# Patient Record
Sex: Female | Born: 1975 | Race: White | Hispanic: No | Marital: Married
Health system: Southern US, Community
[De-identification: ages and names within clinical notes are randomized; demographics above are authoritative.]

## PROBLEM LIST (undated history)

## (undated) DIAGNOSIS — Z720 Tobacco use: Secondary | ICD-10-CM

## (undated) DIAGNOSIS — G894 Chronic pain syndrome: Secondary | ICD-10-CM

## (undated) DIAGNOSIS — F418 Other specified anxiety disorders: Secondary | ICD-10-CM

## (undated) DIAGNOSIS — F1990 Other psychoactive substance use, unspecified, uncomplicated: Secondary | ICD-10-CM

## (undated) HISTORY — PX: KIDNEY SURGERY: SHX687

## (undated) HISTORY — PX: APPENDECTOMY: SHX54

---

## 2013-02-11 DIAGNOSIS — Z79891 Long term (current) use of opiate analgesic: Secondary | ICD-10-CM

## 2016-11-25 DIAGNOSIS — E871 Hypo-osmolality and hyponatremia: Secondary | ICD-10-CM

## 2016-11-25 DIAGNOSIS — F1910 Other psychoactive substance abuse, uncomplicated: Secondary | ICD-10-CM

## 2016-11-25 DIAGNOSIS — A419 Sepsis, unspecified organism: Secondary | ICD-10-CM

## 2016-11-25 DIAGNOSIS — E876 Hypokalemia: Secondary | ICD-10-CM

## 2016-11-25 DIAGNOSIS — J181 Lobar pneumonia, unspecified organism: Secondary | ICD-10-CM

## 2016-11-25 DIAGNOSIS — N179 Acute kidney failure, unspecified: Secondary | ICD-10-CM | POA: Diagnosis not present

## 2016-11-25 DIAGNOSIS — N390 Urinary tract infection, site not specified: Secondary | ICD-10-CM

## 2016-11-26 DIAGNOSIS — N179 Acute kidney failure, unspecified: Secondary | ICD-10-CM | POA: Diagnosis not present

## 2016-11-26 DIAGNOSIS — J181 Lobar pneumonia, unspecified organism: Secondary | ICD-10-CM | POA: Diagnosis not present

## 2016-11-26 DIAGNOSIS — A419 Sepsis, unspecified organism: Secondary | ICD-10-CM | POA: Diagnosis not present

## 2016-11-26 DIAGNOSIS — F1910 Other psychoactive substance abuse, uncomplicated: Secondary | ICD-10-CM | POA: Diagnosis not present

## 2016-11-27 DIAGNOSIS — N390 Urinary tract infection, site not specified: Secondary | ICD-10-CM

## 2016-11-27 DIAGNOSIS — A419 Sepsis, unspecified organism: Secondary | ICD-10-CM

## 2016-11-27 DIAGNOSIS — F1910 Other psychoactive substance abuse, uncomplicated: Secondary | ICD-10-CM | POA: Diagnosis not present

## 2016-11-27 DIAGNOSIS — N179 Acute kidney failure, unspecified: Secondary | ICD-10-CM | POA: Diagnosis not present

## 2016-11-27 DIAGNOSIS — E871 Hypo-osmolality and hyponatremia: Secondary | ICD-10-CM

## 2016-11-27 DIAGNOSIS — J181 Lobar pneumonia, unspecified organism: Secondary | ICD-10-CM | POA: Diagnosis not present

## 2016-11-27 DIAGNOSIS — E876 Hypokalemia: Secondary | ICD-10-CM

## 2016-11-28 DIAGNOSIS — N179 Acute kidney failure, unspecified: Secondary | ICD-10-CM | POA: Diagnosis not present

## 2016-11-28 DIAGNOSIS — A419 Sepsis, unspecified organism: Secondary | ICD-10-CM | POA: Diagnosis not present

## 2016-11-28 DIAGNOSIS — F1910 Other psychoactive substance abuse, uncomplicated: Secondary | ICD-10-CM | POA: Diagnosis not present

## 2016-11-28 DIAGNOSIS — J181 Lobar pneumonia, unspecified organism: Secondary | ICD-10-CM | POA: Diagnosis not present

## 2016-11-29 DIAGNOSIS — N179 Acute kidney failure, unspecified: Secondary | ICD-10-CM | POA: Diagnosis not present

## 2016-11-29 DIAGNOSIS — F1910 Other psychoactive substance abuse, uncomplicated: Secondary | ICD-10-CM | POA: Diagnosis not present

## 2016-11-29 DIAGNOSIS — J181 Lobar pneumonia, unspecified organism: Secondary | ICD-10-CM | POA: Diagnosis not present

## 2016-11-29 DIAGNOSIS — A419 Sepsis, unspecified organism: Secondary | ICD-10-CM | POA: Diagnosis not present

## 2016-12-10 ENCOUNTER — Encounter (HOSPITAL_COMMUNITY): Payer: Self-pay | Admitting: Internal Medicine

## 2016-12-10 ENCOUNTER — Inpatient Hospital Stay (HOSPITAL_COMMUNITY)
Admission: AD | Admit: 2016-12-10 | Discharge: 2016-12-13 | DRG: 485 | Disposition: A | Discharge status: Other Acute Inpatient Hospital | Payer: Medicaid Other | Source: Other Acute Inpatient Hospital | Attending: Internal Medicine | Admitting: Internal Medicine

## 2016-12-10 DIAGNOSIS — D649 Anemia, unspecified: Secondary | ICD-10-CM | POA: Diagnosis present

## 2016-12-10 DIAGNOSIS — M545 Low back pain, unspecified: Secondary | ICD-10-CM

## 2016-12-10 DIAGNOSIS — M25552 Pain in left hip: Secondary | ICD-10-CM | POA: Diagnosis present

## 2016-12-10 DIAGNOSIS — K219 Gastro-esophageal reflux disease without esophagitis: Secondary | ICD-10-CM | POA: Diagnosis present

## 2016-12-10 DIAGNOSIS — M009 Pyogenic arthritis, unspecified: Secondary | ICD-10-CM

## 2016-12-10 DIAGNOSIS — I330 Acute and subacute infective endocarditis: Secondary | ICD-10-CM | POA: Diagnosis present

## 2016-12-10 DIAGNOSIS — G894 Chronic pain syndrome: Secondary | ICD-10-CM | POA: Diagnosis present

## 2016-12-10 DIAGNOSIS — Z9049 Acquired absence of other specified parts of digestive tract: Secondary | ICD-10-CM | POA: Diagnosis not present

## 2016-12-10 DIAGNOSIS — M461 Sacroiliitis, not elsewhere classified: Secondary | ICD-10-CM

## 2016-12-10 DIAGNOSIS — M25569 Pain in unspecified knee: Secondary | ICD-10-CM

## 2016-12-10 DIAGNOSIS — M00062 Staphylococcal arthritis, left knee: Secondary | ICD-10-CM | POA: Diagnosis present

## 2016-12-10 DIAGNOSIS — I76 Septic arterial embolism: Secondary | ICD-10-CM | POA: Diagnosis not present

## 2016-12-10 DIAGNOSIS — R000 Tachycardia, unspecified: Secondary | ICD-10-CM | POA: Diagnosis not present

## 2016-12-10 DIAGNOSIS — Z720 Tobacco use: Secondary | ICD-10-CM | POA: Diagnosis present

## 2016-12-10 DIAGNOSIS — B9561 Methicillin susceptible Staphylococcus aureus infection as the cause of diseases classified elsewhere: Secondary | ICD-10-CM | POA: Diagnosis present

## 2016-12-10 DIAGNOSIS — A4101 Sepsis due to Methicillin susceptible Staphylococcus aureus: Secondary | ICD-10-CM

## 2016-12-10 DIAGNOSIS — Z9889 Other specified postprocedural states: Secondary | ICD-10-CM

## 2016-12-10 DIAGNOSIS — I079 Rheumatic tricuspid valve disease, unspecified: Secondary | ICD-10-CM | POA: Diagnosis not present

## 2016-12-10 DIAGNOSIS — K5900 Constipation, unspecified: Secondary | ICD-10-CM | POA: Diagnosis not present

## 2016-12-10 DIAGNOSIS — I368 Other nonrheumatic tricuspid valve disorders: Secondary | ICD-10-CM

## 2016-12-10 DIAGNOSIS — Z811 Family history of alcohol abuse and dependence: Secondary | ICD-10-CM

## 2016-12-10 DIAGNOSIS — R0600 Dyspnea, unspecified: Secondary | ICD-10-CM

## 2016-12-10 DIAGNOSIS — F1910 Other psychoactive substance abuse, uncomplicated: Secondary | ICD-10-CM | POA: Diagnosis present

## 2016-12-10 DIAGNOSIS — F418 Other specified anxiety disorders: Secondary | ICD-10-CM | POA: Diagnosis present

## 2016-12-10 DIAGNOSIS — F1990 Other psychoactive substance use, unspecified, uncomplicated: Secondary | ICD-10-CM | POA: Diagnosis present

## 2016-12-10 DIAGNOSIS — F1120 Opioid dependence, uncomplicated: Secondary | ICD-10-CM | POA: Diagnosis present

## 2016-12-10 DIAGNOSIS — B9689 Other specified bacterial agents as the cause of diseases classified elsewhere: Secondary | ICD-10-CM | POA: Diagnosis present

## 2016-12-10 DIAGNOSIS — R7881 Bacteremia: Secondary | ICD-10-CM

## 2016-12-10 DIAGNOSIS — I2690 Septic pulmonary embolism without acute cor pulmonale: Secondary | ICD-10-CM | POA: Diagnosis not present

## 2016-12-10 DIAGNOSIS — Z882 Allergy status to sulfonamides status: Secondary | ICD-10-CM | POA: Diagnosis present

## 2016-12-10 DIAGNOSIS — R2689 Other abnormalities of gait and mobility: Secondary | ICD-10-CM

## 2016-12-10 HISTORY — DX: Tobacco use: Z72.0

## 2016-12-10 HISTORY — DX: Chronic pain syndrome: G89.4

## 2016-12-10 HISTORY — DX: Other specified anxiety disorders: F41.8

## 2016-12-10 HISTORY — DX: Other psychoactive substance use, unspecified, uncomplicated: F19.90

## 2016-12-10 MED ORDER — SODIUM CHLORIDE 0.9% FLUSH
3.0000 mL | Freq: Two times a day (BID) | INTRAVENOUS | Status: DC
  Administered 2016-12-10 – 2016-12-11 (×2): 3 mL via INTRAVENOUS

## 2016-12-10 MED ORDER — ACETAMINOPHEN 325 MG PO TABS: 650.0000 mg | ORAL_TABLET | Freq: Four times a day (QID) | ORAL | Status: DC | PRN

## 2016-12-10 MED ORDER — FAMOTIDINE 20 MG PO TABS
20.0000 mg | ORAL_TABLET | Freq: Two times a day (BID) | ORAL | Status: DC
  Administered 2016-12-10 – 2016-12-13 (×6): 20 mg via ORAL
  Filled 2016-12-10 (×6): qty 1

## 2016-12-10 MED ORDER — AMITRIPTYLINE HCL 25 MG PO TABS
25.0000 mg | ORAL_TABLET | Freq: Every day | ORAL | Status: DC
  Administered 2016-12-10 – 2016-12-12 (×3): 25 mg via ORAL
  Filled 2016-12-10 (×3): qty 1

## 2016-12-10 MED ORDER — GABAPENTIN 600 MG PO TABS
600.0000 mg | ORAL_TABLET | Freq: Three times a day (TID) | ORAL | Status: DC
  Administered 2016-12-10 – 2016-12-13 (×9): 600 mg via ORAL
  Filled 2016-12-10 (×9): qty 1

## 2016-12-10 MED ORDER — FERROUS SULFATE 325 (65 FE) MG PO TABS
325.0000 mg | ORAL_TABLET | Freq: Every day | ORAL | Status: DC
  Administered 2016-12-11: 325 mg via ORAL
  Filled 2016-12-10: qty 1

## 2016-12-10 MED ORDER — DULOXETINE HCL 30 MG PO CPEP
30.0000 mg | ORAL_CAPSULE | Freq: Every day | ORAL | Status: DC
  Administered 2016-12-11 – 2016-12-13 (×3): 30 mg via ORAL
  Filled 2016-12-10 (×3): qty 1

## 2016-12-10 MED ORDER — ZOLPIDEM TARTRATE 5 MG PO TABS
5.0000 mg | ORAL_TABLET | Freq: Every evening | ORAL | Status: DC | PRN
  Administered 2016-12-10 – 2016-12-11 (×2): 5 mg via ORAL
  Filled 2016-12-10 (×2): qty 1

## 2016-12-10 MED ORDER — ONDANSETRON HCL 4 MG PO TABS: 4.0000 mg | ORAL_TABLET | Freq: Four times a day (QID) | ORAL | Status: DC | PRN

## 2016-12-10 MED ORDER — METHADONE HCL 5 MG PO TABS
10.0000 mg | ORAL_TABLET | Freq: Two times a day (BID) | ORAL | Status: DC
Start: 2016-12-10 — End: 2016-12-11
  Administered 2016-12-10 – 2016-12-11 (×2): 10 mg via ORAL
  Filled 2016-12-10 (×2): qty 2

## 2016-12-10 MED ORDER — CLONAZEPAM 0.5 MG PO TABS
0.5000 mg | ORAL_TABLET | Freq: Two times a day (BID) | ORAL | Status: DC | PRN
  Administered 2016-12-10 – 2016-12-12 (×4): 0.5 mg via ORAL
  Filled 2016-12-10 (×4): qty 1

## 2016-12-10 MED ORDER — NICOTINE 21 MG/24HR TD PT24
21.0000 mg | MEDICATED_PATCH | Freq: Every day | TRANSDERMAL | Status: DC
  Administered 2016-12-10 – 2016-12-13 (×4): 21 mg via TRANSDERMAL
  Filled 2016-12-10 (×4): qty 1

## 2016-12-10 MED ORDER — POLYETHYLENE GLYCOL 3350 17 G PO PACK
17.0000 g | PACK | Freq: Every day | ORAL | Status: DC | PRN
  Administered 2016-12-13: 17 g via ORAL
  Filled 2016-12-10: qty 1

## 2016-12-10 MED ORDER — CEFAZOLIN SODIUM-DEXTROSE 2-4 GM/100ML-% IV SOLN
2.0000 g | Freq: Three times a day (TID) | INTRAVENOUS | Status: DC
  Administered 2016-12-10 – 2016-12-13 (×9): 2 g via INTRAVENOUS
  Filled 2016-12-10 (×11): qty 100

## 2016-12-10 MED ORDER — SODIUM CHLORIDE 0.9 % IV SOLN
INTRAVENOUS | Status: DC
  Administered 2016-12-10 – 2016-12-11 (×2): via INTRAVENOUS

## 2016-12-10 MED ORDER — DOCUSATE SODIUM 100 MG PO CAPS
100.0000 mg | ORAL_CAPSULE | Freq: Every day | ORAL | Status: DC
  Administered 2016-12-11 – 2016-12-13 (×3): 100 mg via ORAL
  Filled 2016-12-10 (×3): qty 1

## 2016-12-10 MED ORDER — DM-GUAIFENESIN ER 30-600 MG PO TB12: 1.0000 | ORAL_TABLET | Freq: Two times a day (BID) | ORAL | Status: DC | PRN

## 2016-12-10 MED ORDER — SODIUM CHLORIDE 0.9 % IV BOLUS (SEPSIS)
1000.0000 mL | Freq: Once | INTRAVENOUS | Status: AC
  Administered 2016-12-10: 1000 mL via INTRAVENOUS

## 2016-12-10 NOTE — H&P (Signed)
History and Physical    Gabriella Ramos ZOX:[id number] DOB: Jan 11, 1976 DOA: 12/10/2016  Referring MD/NP/PA:   PCP: No primary care provider on file.   Patient coming from:  The patient is coming from home.  At baseline, pt is independent for most of ADL.  Chief Complaint: left knee pain and swelling  HPI: Gabriella Ramos is a 41 y.o. female with medical history significant of polysubstance abuse, tobacco abuse, anemia, IVDU, chronic pain syndrome, depression, anxiety, GERD, constipation, who presents with left knee pain and swelling.  Patient was recently admitted to [facility] on 11/25/16 due to right lower lobe pneumonia with multiple bilateral foci. She was found to have acute endocarditis with a fragile large tricuspid vegetation that it was 2 cm in size by 2-D echo. She was transferred to [facility] for continuation of IV Abx of Cefazoline for toal of 8 weeks with a stop date of 01/13/17. While in [facility], patient complains of left kidney swelling and pain. Left knee joint aspiration was done, and culture of synovial fluid grew out methicillin-sensitive staph aureus. Ortho was consulted in [facility] and recommended knee washout. Per report, pt has anemia, hemolytic anemia workup tests including indirect bilirubin, LDH, haptoglobin and peripheral smear were sent out, the results are pending. Pt was transfused with one units of blood on 12/09/16. Her Hgb was 7.9 on 12/10/16. Pt had negative left LE venous doppler for DVT on 12/05/16.  When I saw pt on the floor, she reports left knee pain, which is constant, 10 out of 10 in severity, nonradiating. She does not have a fever, chills. She has some mild dry cough, but no shortness breath or chest pain. No symptoms of  Flu. Patient has nausea, but no vomiting, abdominal pain or diarrhea. She reports dysuria and burning on urination. No unilateral weakness.  ED Course: pt was found to have   Review of Systems:    General: no fevers, chills, no changes in body weight, has poor appetite, has fatigue HEENT: no blurry vision, hearing changes or sore throat Respiratory: no dyspnea, has coughing, no wheezing CV: no chest pain, no palpitations GI: has nausea, no vomiting, abdominal pain, diarrhea, constipation GU: no dysuria, burning on urination, increased urinary frequency, hematuria  Ext: no leg edema. Left knee joint is swelling and tender Neuro: no unilateral weakness, numbness, or tingling, no vision change or hearing loss Skin: no rash, no skin tear. MSK: No muscle spasm, no deformity, no limitation of range of movement in spin Heme: No easy bruising.  Travel history: No recent long distant travel.  Allergy:  Allergies  Allergen Reactions  . Sulfa Antibiotics     Past Medical History:  Diagnosis Date  . Chronic pain syndrome   . Depression with anxiety   . IVDU (intravenous drug user)   . Tobacco abuse     Past Surgical History:  Procedure Laterality Date  . APPENDECTOMY    . CESAREAN SECTION    . KIDNEY SURGERY      Social History:  has no tobacco, alcohol, and drug history on file.  Family History:  Family History  Problem Relation Age of Onset  . Alcoholism Mother      Prior to Admission medications   Not on File    Physical Exam: Vitals:   12/10/16 2100  BP: 129/86  Pulse: (!) 102  Resp: 18  Temp: 98.7 F (37.1 C)  SpO2: 98%   General: Not in acute distress HEENT:  Eyes: PERRL, EOMI, no scleral icterus.       ENT: No discharge from the ears and nose, no pharynx injection, no tonsillar enlargement.        Neck: No JVD, no bruit, no mass felt. Heme: No neck lymph node enlargement. Cardiac: S1/S2, RRR, No murmurs, No gallops or rubs. Respiratory: Good air movement bilaterally. No rales, wheezing, rhonchi or rubs. GI: Soft, nondistended, nontender, no rebound pain, no organomegaly, BS present. GU: No hematuria Ext: No pitting leg edema bilaterally.  2+DP/PT pulse bilaterally. Musculoskeletal: No joint deformities, No joint redness or warmth, no limitation of ROM in spin. Skin: No rashes.  Neuro: Alert, oriented X3, cranial nerves II-XII grossly intact, moves all extremities normally. Muscle strength 5/5 in all extremities, sensation to light touch intact. Brachial reflex 2+ bilaterally. Knee reflex 1+ bilaterally. Negative Babinski's sign. Normal finger to nose test. Psych: Patient is not psychotic, no suicidal or hemocidal ideation.  Labs on Admission: I have personally reviewed following labs and imaging studies  CBC: No results for input(s): WBC, NEUTROABS, HGB, HCT, MCV, PLT in the last 168 hours. Basic Metabolic Panel: No results for input(s): NA, K, CL, CO2, GLUCOSE, BUN, CREATININE, CALCIUM, MG, PHOS in the last 168 hours. GFR: CrCl cannot be calculated (No order found.). Liver Function Tests: No results for input(s): AST, ALT, ALKPHOS, BILITOT, PROT, ALBUMIN in the last 168 hours. No results for input(s): LIPASE, AMYLASE in the last 168 hours. No results for input(s): AMMONIA in the last 168 hours. Coagulation Profile: No results for input(s): INR, PROTIME in the last 168 hours. Cardiac Enzymes: No results for input(s): CKTOTAL, CKMB, CKMBINDEX, TROPONINI in the last 168 hours. BNP (last 3 results) No results for input(s): PROBNP in the last 8760 hours. HbA1C: No results for input(s): HGBA1C in the last 72 hours. CBG: No results for input(s): GLUCAP in the last 168 hours. Lipid Profile: No results for input(s): CHOL, HDL, LDLCALC, TRIG, CHOLHDL, LDLDIRECT in the last 72 hours. Thyroid Function Tests: No results for input(s): TSH, T4TOTAL, FREET4, T3FREE, THYROIDAB in the last 72 hours. Anemia Panel: No results for input(s): VITAMINB12, FOLATE, FERRITIN, TIBC, IRON, RETICCTPCT in the last 72 hours. Urine analysis: No results found for: COLORURINE, APPEARANCEUR, LABSPEC, PHURINE, GLUCOSEU, HGBUR, BILIRUBINUR, KETONESUR,  PROTEINUR, UROBILINOGEN, NITRITE, LEUKOCYTESUR Sepsis Labs: @LABRCNTIP (procalcitonin:4,lacticidven:4) )No results found for this or any previous visit (from the past 240 hour(s)).   Radiological Exams on Admission: No results found.   EKG: Not done yet.   Assessment/Plan Principal Problem:   Septic arthritis of knee, left (HCC) Active Problems:   Tobacco abuse   IVDU (intravenous drug user)   Depression with anxiety   Bacterial endocarditis   Normocytic anemia   Polysubstance abuse   Constipation   Chronic pain syndrome   GERD (gastroesophageal reflux disease)   Septic arthritis of knee, left [facility]): Patient is not septic. Lactate is 0.7. Hemodynamically stable. -admit to tele bed as inpt -Continue IV cefazolin per pharm -f/u blood culture -please call ortho in AM -continue home methadone for pain  Normocytic anemia:  Pt was transfused with one units of blood on 12/09/16. Her Hgb was 7.9 on 12/10/16. Her hgb dropped again to 6.8 today. etiology is not clear. -will check  LDH, haptoglobin and peripheral smear and FOBT -will transfuse 1 unit of blood  Bacterial endocarditis: -On IV cefazolin  Polysubstance abuse, IVDU and Tobacco abuse -Did counseling about importance of quitting substance -Nicotine patch -check UDS  Depression and anxiety: Stable, no suicidal  or homicidal ideations. -Continue home medications: Amitriptyline, Cymbalta, clonopin  Constipation: -Miralax and docusate  Chronic pain syndrome: -methadone  GERD: -Pepcid   DVT ppx: SCD Code Status: Full code Family Communication: None at bed side.      Disposition Plan:  Anticipate discharge back to previous home environment Consults called:  none Admission status: Inpatient/tele      Date of Service 12/10/2016    Lorretta HarpIU, Andalyn Heckstall [facility] Pager [phone]  If 7PM-7AM, please contact night-coverage [email or web address] Password [email or web address] 12/10/2016, 11:38 PM

## 2016-12-10 NOTE — Progress Notes (Signed)
Pharmacy Antibiotic Note  Gabriella Ramos is a 41 y.o. female admitted on 12/10/2016 with septic joint (MSSA).  Pharmacy has been consulted for cefazolin dosing.  Afebrile, baseline labs pending. Per consult note, knee aspiration grew out MSSA.No history on chart will assume normal labs at this time and adjust in the morning as needed.  Plan: Ancef 2g IV q8 hours Follow up baseline labs for adjustments.     Temp (24hrs), Avg:98.7 F (37.1 C), Min:98.7 F (37.1 C), Max:98.7 F (37.1 C)  No results for input(s): WBC, CREATININE, LATICACIDVEN, VANCOTROUGH, VANCOPEAK, VANCORANDOM, GENTTROUGH, GENTPEAK, GENTRANDOM, TOBRATROUGH, TOBRAPEAK, TOBRARND, AMIKACINPEAK, AMIKACINTROU, AMIKACIN in the last 168 hours.  CrCl cannot be calculated (No order found.).    Allergies not on file  Thank you for allowing pharmacy to be a part of this patient's care.  Sheppard CoilFrank Maleik Vanderzee PharmD., BCPS Clinical Pharmacist Pager [phone] 12/10/2016 10:43 PM

## 2016-12-11 ENCOUNTER — Encounter (HOSPITAL_COMMUNITY)
Admission: AD | Disposition: A | Discharge status: Other Acute Inpatient Hospital | Payer: Self-pay | Source: Other Acute Inpatient Hospital | Attending: Internal Medicine

## 2016-12-11 ENCOUNTER — Inpatient Hospital Stay (HOSPITAL_COMMUNITY): Payer: Medicaid Other | Admitting: Certified Registered Nurse Anesthetist

## 2016-12-11 ENCOUNTER — Encounter (HOSPITAL_COMMUNITY): Payer: Self-pay | Admitting: *Deleted

## 2016-12-11 ENCOUNTER — Inpatient Hospital Stay (HOSPITAL_COMMUNITY): Payer: Medicaid Other

## 2016-12-11 DIAGNOSIS — D649 Anemia, unspecified: Secondary | ICD-10-CM

## 2016-12-11 DIAGNOSIS — F418 Other specified anxiety disorders: Secondary | ICD-10-CM

## 2016-12-11 DIAGNOSIS — G894 Chronic pain syndrome: Secondary | ICD-10-CM

## 2016-12-11 HISTORY — PX: IRRIGATION AND DEBRIDEMENT KNEE: SHX5185

## 2016-12-11 LAB — PROTIME-INR
INR: 1.28
PROTHROMBIN TIME: 16 s — AB (ref 11.4–15.2)

## 2016-12-11 LAB — LACTATE DEHYDROGENASE: LDH: 88 U/L — AB (ref 98–192)

## 2016-12-11 LAB — CBC
HCT: 23.2 % — ABNORMAL LOW (ref 36.0–46.0)
Hemoglobin: 7.3 g/dL — ABNORMAL LOW (ref 12.0–15.0)
MCH: 26.8 pg (ref 26.0–34.0)
MCHC: 31.5 g/dL (ref 30.0–36.0)
MCV: 85.3 fL (ref 78.0–100.0)
PLATELETS: 301 10*3/uL (ref 150–400)
RBC: 2.72 MIL/uL — AB (ref 3.87–5.11)
RDW: 15.2 % (ref 11.5–15.5)
WBC: 5.8 10*3/uL (ref 4.0–10.5)

## 2016-12-11 LAB — BASIC METABOLIC PANEL
Anion gap: 8 (ref 5–15)
Anion gap: 9 (ref 5–15)
BUN: 15 mg/dL (ref 6–20)
BUN: 17 mg/dL (ref 6–20)
CALCIUM: 8 mg/dL — AB (ref 8.9–10.3)
CALCIUM: 7.9 mg/dL — AB (ref 8.9–10.3)
CHLORIDE: 106 mmol/L (ref 101–111)
CO2: 21 mmol/L — ABNORMAL LOW (ref 22–32)
CO2: 23 mmol/L (ref 22–32)
CREATININE: 0.97 mg/dL (ref 0.44–1.00)
CREATININE: 0.98 mg/dL (ref 0.44–1.00)
Chloride: 105 mmol/L (ref 101–111)
GLUCOSE: 96 mg/dL (ref 65–99)
Glucose, Bld: 106 mg/dL — ABNORMAL HIGH (ref 65–99)
Potassium: 4.3 mmol/L (ref 3.5–5.1)
Potassium: 4.3 mmol/L (ref 3.5–5.1)
SODIUM: 136 mmol/L (ref 135–145)
Sodium: 136 mmol/L (ref 135–145)

## 2016-12-11 LAB — CBC WITH DIFFERENTIAL/PLATELET
BASOS PCT: 1 %
Basophils Absolute: 0 10*3/uL (ref 0.0–0.1)
EOS ABS: 0 10*3/uL (ref 0.0–0.7)
EOS PCT: 1 %
HCT: 21.2 % — ABNORMAL LOW (ref 36.0–46.0)
HEMOGLOBIN: 6.8 g/dL — AB (ref 12.0–15.0)
Lymphocytes Relative: 21 %
Lymphs Abs: 1.4 10*3/uL (ref 0.7–4.0)
MCH: 27.2 pg (ref 26.0–34.0)
MCHC: 32.1 g/dL (ref 30.0–36.0)
MCV: 84.8 fL (ref 78.0–100.0)
MONOS PCT: 7 %
Monocytes Absolute: 0.5 10*3/uL (ref 0.1–1.0)
NEUTROS PCT: 71 %
Neutro Abs: 4.6 10*3/uL (ref 1.7–7.7)
PLATELETS: 311 10*3/uL (ref 150–400)
RBC: 2.5 MIL/uL — ABNORMAL LOW (ref 3.87–5.11)
RDW: 15.1 % (ref 11.5–15.5)
WBC: 6.5 10*3/uL (ref 4.0–10.5)

## 2016-12-11 LAB — SURGICAL PCR SCREEN
MRSA, PCR: NEGATIVE
STAPHYLOCOCCUS AUREUS: NEGATIVE

## 2016-12-11 LAB — PREPARE RBC (CROSSMATCH)

## 2016-12-11 LAB — LACTIC ACID, PLASMA: LACTIC ACID, VENOUS: 0.7 mmol/L (ref 0.5–1.9)

## 2016-12-11 LAB — ABO/RH: ABO/RH(D): O POS

## 2016-12-11 LAB — SYNOVIAL CELL COUNT + DIFF, W/ CRYSTALS
Crystals, Fluid: NONE SEEN
EOSINOPHILS-SYNOVIAL: 0 % (ref 0–1)
LYMPHOCYTES-SYNOVIAL FLD: 10 % (ref 0–20)
MONOCYTE-MACROPHAGE-SYNOVIAL FLUID: 7 % — AB (ref 50–90)
Neutrophil, Synovial: 83 % — ABNORMAL HIGH (ref 0–25)
WBC, Synovial: 11650 /mm3 — ABNORMAL HIGH (ref 0–200)

## 2016-12-11 LAB — APTT: aPTT: 52 seconds — ABNORMAL HIGH (ref 24–36)

## 2016-12-11 LAB — SAVE SMEAR

## 2016-12-11 LAB — GLUCOSE, CAPILLARY: GLUCOSE-CAPILLARY: 85 mg/dL (ref 65–99)

## 2016-12-11 SURGERY — IRRIGATION AND DEBRIDEMENT KNEE
Anesthesia: General | Site: Knee | Laterality: Left

## 2016-12-11 MED ORDER — SUCCINYLCHOLINE CHLORIDE 20 MG/ML IJ SOLN
INTRAMUSCULAR | Status: DC | PRN
  Administered 2016-12-11: 140 mg via INTRAVENOUS

## 2016-12-11 MED ORDER — ONDANSETRON HCL 4 MG/2ML IJ SOLN
INTRAMUSCULAR | Status: AC
  Filled 2016-12-11: qty 2

## 2016-12-11 MED ORDER — PROPOFOL 10 MG/ML IV BOLUS
INTRAVENOUS | Status: DC | PRN
  Administered 2016-12-11: 150 mg via INTRAVENOUS
  Administered 2016-12-11: 50 mg via INTRAVENOUS

## 2016-12-11 MED ORDER — METOCLOPRAMIDE HCL 5 MG PO TABS
5.0000 mg | ORAL_TABLET | Freq: Three times a day (TID) | ORAL | Status: DC | PRN
  Administered 2016-12-13: 10 mg via ORAL
  Filled 2016-12-11: qty 2

## 2016-12-11 MED ORDER — LIDOCAINE HCL (CARDIAC) 20 MG/ML IV SOLN
INTRAVENOUS | Status: DC | PRN
  Administered 2016-12-11: 100 mg via INTRAVENOUS

## 2016-12-11 MED ORDER — LACTATED RINGERS IV SOLN
INTRAVENOUS | Status: DC | PRN
  Administered 2016-12-11: 18:00:00 via INTRAVENOUS

## 2016-12-11 MED ORDER — METHOCARBAMOL 500 MG PO TABS
ORAL_TABLET | ORAL | Status: AC
  Filled 2016-12-11: qty 1

## 2016-12-11 MED ORDER — CEFAZOLIN SODIUM-DEXTROSE 2-4 GM/100ML-% IV SOLN: 2.0000 g | INTRAVENOUS | Status: DC

## 2016-12-11 MED ORDER — CHLORHEXIDINE GLUCONATE 4 % EX LIQD
60.0000 mL | Freq: Once | CUTANEOUS | Status: AC
  Administered 2016-12-11: 4 via TOPICAL
  Filled 2016-12-11 (×2): qty 60

## 2016-12-11 MED ORDER — MORPHINE SULFATE ER 30 MG PO TBCR
60.0000 mg | EXTENDED_RELEASE_TABLET | Freq: Two times a day (BID) | ORAL | Status: DC
  Administered 2016-12-11 – 2016-12-13 (×4): 60 mg via ORAL
  Filled 2016-12-11 (×4): qty 2

## 2016-12-11 MED ORDER — OXYCODONE HCL 5 MG/5ML PO SOLN: 5.0000 mg | Freq: Once | ORAL | Status: AC | PRN

## 2016-12-11 MED ORDER — HYDROMORPHONE HCL 1 MG/ML IJ SOLN
0.2500 mg | INTRAMUSCULAR | Status: DC | PRN
  Administered 2016-12-11 (×4): 0.5 mg via INTRAVENOUS

## 2016-12-11 MED ORDER — OXYCODONE HCL 5 MG PO TABS
ORAL_TABLET | ORAL | Status: AC
  Filled 2016-12-11: qty 1

## 2016-12-11 MED ORDER — HYDROMORPHONE HCL 2 MG PO TABS: 2.0000 mg | ORAL_TABLET | ORAL | Status: DC | PRN

## 2016-12-11 MED ORDER — METHOCARBAMOL 500 MG PO TABS
500.0000 mg | ORAL_TABLET | Freq: Four times a day (QID) | ORAL | Status: DC | PRN
  Administered 2016-12-11 – 2016-12-13 (×4): 500 mg via ORAL
  Filled 2016-12-11 (×3): qty 1

## 2016-12-11 MED ORDER — OXYCODONE HCL 5 MG PO TABS
5.0000 mg | ORAL_TABLET | Freq: Once | ORAL | Status: AC | PRN
  Administered 2016-12-11: 5 mg via ORAL

## 2016-12-11 MED ORDER — SODIUM CHLORIDE 0.9 % IV SOLN
INTRAVENOUS | Status: DC
  Administered 2016-12-11: 23:00:00 via INTRAVENOUS

## 2016-12-11 MED ORDER — ONDANSETRON HCL 4 MG/2ML IJ SOLN
INTRAMUSCULAR | Status: DC | PRN
  Administered 2016-12-11: 4 mg via INTRAVENOUS

## 2016-12-11 MED ORDER — MIDAZOLAM HCL 2 MG/2ML IJ SOLN
INTRAMUSCULAR | Status: AC
  Filled 2016-12-11: qty 2

## 2016-12-11 MED ORDER — SODIUM CHLORIDE 0.9 % IR SOLN
Status: DC | PRN
  Administered 2016-12-11: 1000 mL
  Administered 2016-12-11 (×3): 3000 mL

## 2016-12-11 MED ORDER — HYDROMORPHONE HCL 1 MG/ML IJ SOLN
INTRAMUSCULAR | Status: AC
  Filled 2016-12-11: qty 1

## 2016-12-11 MED ORDER — HYDROMORPHONE HCL 1 MG/ML IJ SOLN
0.5000 mg | Freq: Once | INTRAMUSCULAR | Status: AC
  Administered 2016-12-11: 0.5 mg via INTRAVENOUS
  Filled 2016-12-11: qty 1

## 2016-12-11 MED ORDER — SODIUM CHLORIDE 0.9 % IV SOLN: Freq: Once | INTRAVENOUS | Status: DC

## 2016-12-11 MED ORDER — MIDAZOLAM HCL 5 MG/5ML IJ SOLN
INTRAMUSCULAR | Status: DC | PRN
  Administered 2016-12-11: 2 mg via INTRAVENOUS

## 2016-12-11 MED ORDER — OXYCODONE HCL 5 MG PO TABS
5.0000 mg | ORAL_TABLET | Freq: Once | ORAL | Status: AC
  Administered 2016-12-11: 5 mg via ORAL
  Filled 2016-12-11: qty 1

## 2016-12-11 MED ORDER — SODIUM CHLORIDE 0.9% FLUSH: 10.0000 mL | INTRAVENOUS | Status: DC | PRN

## 2016-12-11 MED ORDER — FENTANYL CITRATE (PF) 100 MCG/2ML IJ SOLN
INTRAMUSCULAR | Status: AC
  Filled 2016-12-11: qty 4

## 2016-12-11 MED ORDER — FENTANYL CITRATE (PF) 100 MCG/2ML IJ SOLN
INTRAMUSCULAR | Status: AC
  Filled 2016-12-11: qty 2

## 2016-12-11 MED ORDER — METHOCARBAMOL 1000 MG/10ML IJ SOLN
500.0000 mg | Freq: Four times a day (QID) | INTRAVENOUS | Status: DC | PRN
  Filled 2016-12-11: qty 5

## 2016-12-11 MED ORDER — METOCLOPRAMIDE HCL 5 MG/ML IJ SOLN: 5.0000 mg | Freq: Three times a day (TID) | INTRAMUSCULAR | Status: DC | PRN

## 2016-12-11 MED ORDER — CEFAZOLIN SODIUM 1 G IJ SOLR
INTRAMUSCULAR | Status: DC | PRN
  Administered 2016-12-11: 2 g via INTRAMUSCULAR

## 2016-12-11 MED ORDER — HYDROMORPHONE HCL 1 MG/ML IJ SOLN
0.5000 mg | INTRAMUSCULAR | Status: DC | PRN
  Administered 2016-12-11 – 2016-12-13 (×13): 1 mg via INTRAVENOUS
  Filled 2016-12-11 (×13): qty 1

## 2016-12-11 MED ORDER — ENOXAPARIN SODIUM 40 MG/0.4ML ~~LOC~~ SOLN
40.0000 mg | SUBCUTANEOUS | Status: DC
  Administered 2016-12-12 – 2016-12-13 (×2): 40 mg via SUBCUTANEOUS
  Filled 2016-12-11 (×2): qty 0.4

## 2016-12-11 MED ORDER — FENTANYL CITRATE (PF) 100 MCG/2ML IJ SOLN
INTRAMUSCULAR | Status: DC | PRN
  Administered 2016-12-11: 150 ug via INTRAVENOUS
  Administered 2016-12-11: 50 ug via INTRAVENOUS
  Administered 2016-12-11: 25 ug via INTRAVENOUS
  Administered 2016-12-11: 50 ug via INTRAVENOUS
  Administered 2016-12-11: 25 ug via INTRAVENOUS

## 2016-12-11 SURGICAL SUPPLY — 52 items
BLADE SURG 10 STRL SS (BLADE) ×3 IMPLANT
BNDG COHESIVE 4X5 TAN STRL (GAUZE/BANDAGES/DRESSINGS) ×3 IMPLANT
BNDG COHESIVE 6X5 TAN STRL LF (GAUZE/BANDAGES/DRESSINGS) IMPLANT
BNDG ELASTIC 6X10 VLCR STRL LF (GAUZE/BANDAGES/DRESSINGS) ×3 IMPLANT
BNDG GAUZE ELAST 4 BULKY (GAUZE/BANDAGES/DRESSINGS) IMPLANT
CHLORAPREP W/TINT 26ML (MISCELLANEOUS) ×3 IMPLANT
CONT SPECI 4OZ STER CLIK (MISCELLANEOUS) ×3 IMPLANT
COVER SURGICAL LIGHT HANDLE (MISCELLANEOUS) ×3 IMPLANT
DRAPE EXTREMITY T 121X128X90 (DRAPE) ×3 IMPLANT
DRAPE HIP W/POCKET STRL (DRAPE) IMPLANT
DRAPE U-SHAPE 47X51 STRL (DRAPES) ×3 IMPLANT
DRSG ADAPTIC 3X8 NADH LF (GAUZE/BANDAGES/DRESSINGS) IMPLANT
DRSG MEPILEX BORDER 4X4 (GAUZE/BANDAGES/DRESSINGS) ×3 IMPLANT
DRSG MEPILEX BORDER 4X8 (GAUZE/BANDAGES/DRESSINGS) ×3 IMPLANT
DURAPREP 26ML APPLICATOR (WOUND CARE) IMPLANT
ELECT CAUTERY BLADE 6.4 (BLADE) ×3 IMPLANT
ELECT REM PT RETURN 9FT ADLT (ELECTROSURGICAL) ×3
ELECTRODE REM PT RTRN 9FT ADLT (ELECTROSURGICAL) ×1 IMPLANT
EVACUATOR 1/8 PVC DRAIN (DRAIN) ×3 IMPLANT
GAUZE SPONGE 4X4 12PLY STRL (GAUZE/BANDAGES/DRESSINGS) IMPLANT
GLOVE BIO SURGEON STRL SZ8.5 (GLOVE) ×6 IMPLANT
GLOVE BIOGEL PI IND STRL 8.5 (GLOVE) ×1 IMPLANT
GLOVE BIOGEL PI INDICATOR 8.5 (GLOVE) ×2
GLOVE ECLIPSE 7.0 STRL STRAW (GLOVE) ×6 IMPLANT
GOWN STRL REUS W/ TWL XL LVL3 (GOWN DISPOSABLE) ×2 IMPLANT
GOWN STRL REUS W/TWL 2XL LVL3 (GOWN DISPOSABLE) ×3 IMPLANT
GOWN STRL REUS W/TWL XL LVL3 (GOWN DISPOSABLE) ×4
HANDPIECE INTERPULSE COAX TIP (DISPOSABLE)
KIT BASIN OR (CUSTOM PROCEDURE TRAY) ×3 IMPLANT
KIT ROOM TURNOVER OR (KITS) ×3 IMPLANT
MANIFOLD NEPTUNE II (INSTRUMENTS) ×3 IMPLANT
NEEDLE 18GX1X1/2 (RX/OR ONLY) (NEEDLE) ×3 IMPLANT
NS IRRIG 1000ML POUR BTL (IV SOLUTION) ×3 IMPLANT
PACK GENERAL/GYN (CUSTOM PROCEDURE TRAY) ×3 IMPLANT
PACK UNIVERSAL I (CUSTOM PROCEDURE TRAY) ×3 IMPLANT
PAD ARMBOARD 7.5X6 YLW CONV (MISCELLANEOUS) ×6 IMPLANT
PADDING CAST COTTON 6X4 STRL (CAST SUPPLIES) ×6 IMPLANT
SET HNDPC FAN SPRY TIP SCT (DISPOSABLE) IMPLANT
SET IRRIG Y TYPE TUR BLADDER L (SET/KITS/TRAYS/PACK) ×3 IMPLANT
STOCKINETTE IMPERVIOUS 9X36 MD (GAUZE/BANDAGES/DRESSINGS) IMPLANT
STOCKINETTE IMPERVIOUS LG (DRAPES) ×3 IMPLANT
SWAB COLLECTION DEVICE MRSA (MISCELLANEOUS) ×3 IMPLANT
SYRINGE 60CC LL (MISCELLANEOUS) ×6 IMPLANT
TOWEL GREEN STERILE (TOWEL DISPOSABLE) ×3 IMPLANT
TOWEL OR 17X24 6PK STRL BLUE (TOWEL DISPOSABLE) ×3 IMPLANT
TOWEL OR 17X26 10 PK STRL BLUE (TOWEL DISPOSABLE) ×3 IMPLANT
TUBE ANAEROBIC SPECIMEN COL (MISCELLANEOUS) ×3 IMPLANT
TUBE CONNECTING 12'X1/4 (SUCTIONS) ×1
TUBE CONNECTING 12X1/4 (SUCTIONS) ×2 IMPLANT
UNDERPAD 30X30 (UNDERPADS AND DIAPERS) IMPLANT
WATER STERILE IRR 1000ML POUR (IV SOLUTION) ×3 IMPLANT
YANKAUER SUCT BULB TIP NO VENT (SUCTIONS) ×3 IMPLANT

## 2016-12-11 NOTE — Progress Notes (Signed)
CRITICAL VALUE ALERT  Critical value received: hgb 6.8  Date of notification: 12/11/2016  Time of notification: 0053  Critical value read back: Yes  Nurse who received alert: Clement HusbandsJequetta   MD notified (1st page): Craige CottaKirby  Time of first page: [phone]

## 2016-12-11 NOTE — Anesthesia Preprocedure Evaluation (Signed)
Anesthesia Evaluation  Patient identified by MRN, date of birth, ID band Patient awake    Reviewed: Allergy & Precautions, NPO status , Patient's Chart, lab work & pertinent test results  History of Anesthesia Complications Negative for: history of anesthetic complications  Airway Mallampati: II  TM Distance: >3 FB Neck ROM: Full    Dental  (+) Poor Dentition, Missing, Chipped   Pulmonary neg pulmonary ROS,    breath sounds clear to auscultation       Cardiovascular  Rhythm:Regular     Neuro/Psych PSYCHIATRIC DISORDERS negative neurological ROS     GI/Hepatic GERD  ,(+)     substance abuse  IV drug use,   Endo/Other    Renal/GU      Musculoskeletal  (+) Arthritis ,   Abdominal   Peds  Hematology  (+) anemia ,   Anesthesia Other Findings Septic arthritis, septic pulmonary emboli, septic endocarditis  Reproductive/Obstetrics                             Anesthesia Physical Anesthesia Plan  ASA: III  Anesthesia Plan: General   Post-op Pain Management:    Induction: Intravenous  Airway Management Planned: Oral ETT  Additional Equipment: None  Intra-op Plan:   Post-operative Plan: Extubation in OR  Informed Consent: I have reviewed the patients History and Physical, chart, labs and discussed the procedure including the risks, benefits and alternatives for the proposed anesthesia with the patient or authorized representative who has indicated his/her understanding and acceptance.   Dental advisory given  Plan Discussed with: CRNA and Surgeon  Anesthesia Plan Comments:         Anesthesia Quick Evaluation

## 2016-12-11 NOTE — Anesthesia Procedure Notes (Signed)
Procedure Name: Intubation Date/Time: 12/11/2016 6:30 PM Performed by: Rejeana Brock L Pre-anesthesia Checklist: Patient identified, Emergency Drugs available, Suction available and Patient being monitored Patient Re-evaluated:Patient Re-evaluated prior to inductionOxygen Delivery Method: Circle System Utilized Preoxygenation: Pre-oxygenation with 100% oxygen Intubation Type: IV induction Ventilation: Mask ventilation without difficulty Laryngoscope Size: Mac and 3 Grade View: Grade I Tube type: Oral Tube size: 7.0 mm Number of attempts: 1 Airway Equipment and Method: Stylet and Oral airway Placement Confirmation: ETT inserted through vocal cords under direct vision,  positive ETCO2 and breath sounds checked- equal and bilateral Secured at: 21 cm Tube secured with: Tape Dental Injury: Teeth and Oropharynx as per pre-operative assessment

## 2016-12-11 NOTE — Discharge Instructions (Signed)
Weight bearing as tolerated left leg Keep dressing clean and dry

## 2016-12-11 NOTE — Care Management Note (Addendum)
Case Management Note  Patient Details  Name: Gabriella Ramos MRN: [id number] Date of Birth: Jun 12, 1976  Subjective/Objective:    Admitted with septic arthritis of the L knee, hx of [facility] on 11/25/16 due to right lower lobe pneumonia, hx of Polysubstance abuse, IVDU and Tobacco abuse. She was found to have acute endocarditis. [facility] was [facility] for continuation of IV Abx of Cefazoline for toal of 8 weeks with a stop date of 01/13/17.   Chipper HerbDavid Rezek (Other)     [phone]       Action/Plan: Per surgery .Marland Kitchen.Marland Kitchen.Marland Kitchen.Marland Kitchen.open arthrotomy and drainage left knee today. CM to f/u with disposition needs.   Expected Discharge Date:                  Expected Discharge Plan:  LTAC  In-House Referral:  Clinical Social Work  Discharge planning Services  CM Consult  Post Acute Care Choice:    Choice offered to:     DME Arranged:    DME Agency:     HH Arranged:    HH Agency:     Status of Service:  In process, will continue to follow  If discussed at Long Length of Stay Meetings, dates discussed:    Additional Comments:  [name], [name], RN 12/11/2016, 2:12 PM

## 2016-12-11 NOTE — Progress Notes (Signed)
IV team unhooked pt from antibiotic with antibiotic halfway done. Blood has been hung, consent and vitals obtained. Pt resting comfortably. Oncoming nurse informed of this.

## 2016-12-11 NOTE — Interval H&P Note (Signed)
History and Physical Interval Note:  12/11/2016 6:02 PM  Gabriella Ramos  has presented today for surgery, with the diagnosis of SEPTIC ARTHRITIS LEFT KNEE  The various methods of treatment have been discussed with the patient and family. After consideration of risks, benefits and other options for treatment, the patient has consented to  Procedure(s): IRRIGATION AND DEBRIDEMENT KNEE (Left) as a surgical intervention .  The patient's history has been reviewed, patient examined, no change in status, stable for surgery.  I have reviewed the patient's chart and labs.  Questions were answered to the patient's satisfaction.     Jerrik Housholder, Cloyde ReamsBrian James

## 2016-12-11 NOTE — H&P (View-Only) (Signed)
ORTHOPAEDIC CONSULTATION  REQUESTING PHYSICIAN: Osvaldo ShipperGokul Krishnan, MD  PCP:  No primary care provider on file.  Chief Complaint: MSSA septic arthritis, left knee  HPI: Gabriella Ramos is a 41 y.o. female who was [facility] about [facility] weeks ago due to MSSA bacteremia and septic pulmonary emboli. PICC line was placed, and then she was [facility]. The records indicate that she was having knee pain and swelling, and on 12/03/2016 her left knee was aspirated by PM&R, and the cultures are growing MSSA. She was [facility] last night. Orthopedic consultation was placed this morning. The patient complains of left knee pain and swelling. She does not remember the exact duration of time that her left knee has hurt. She says that her left knee pain is 10/10. She also complains of buttock pain that has been present since she fell on the ice and landed on her but.  Past Medical History:  Diagnosis Date  . Chronic pain syndrome   . Depression with anxiety   . IVDU (intravenous drug user)   . Tobacco abuse    Past Surgical History:  Procedure Laterality Date  . APPENDECTOMY    . CESAREAN SECTION    . KIDNEY SURGERY     Social History   Social History  . Marital status: Married    Spouse name: N/A  . Number of children: N/A  . Years of education: N/A   Social History Main Topics  . Smoking status: None  . Smokeless tobacco: None  . Alcohol use None  . Drug use: Unknown  . Sexual activity: Not Asked   Other Topics Concern  . None   Social History Narrative  . None   Family History  Problem Relation Age of Onset  . Alcoholism Mother    Allergies  Allergen Reactions  . Ketorolac Tromethamine Shortness Of Breath  . Sulfa Antibiotics    Prior to Admission medications   Medication Sig Start Date End Date Taking? Authorizing Provider  ALPRAZolam Prudy Feeler(XANAX) 1 MG tablet Take 1 mg by mouth 3 (three) times daily. 12/06/16  Yes  Historical Provider, MD  carisoprodol (SOMA) 350 MG tablet Take 350 mg by mouth 3 (three) times daily. 11/07/16  Yes Historical Provider, MD  diphenhydrAMINE (BENADRYL) 25 mg capsule Take 25 mg by mouth every 6 (six) hours as needed for itching.   Yes Historical Provider, MD  ibuprofen (ADVIL,MOTRIN) 200 MG tablet Take 200 mg by mouth every 6 (six) hours as needed for moderate pain.   Yes Historical Provider, MD  Menthol, Topical Analgesic, (ICY HOT EX) Apply 1 application topically daily as needed (pain).   Yes Historical Provider, MD  oxymorphone (OPANA ER) 20 MG 12 hr tablet Take 20 mg by mouth every 12 (twelve) hours.   Yes Historical Provider, MD   Dg Knee 1-2 Views Left  Result Date: 12/11/2016 CLINICAL DATA:  Status post fall in the snow 2 weeks ago. The patient ports pain radiating from the knee proximally to the hip. EXAM: LEFT KNEE - 1-2 VIEW COMPARISON:  None in PACs FINDINGS: The bones are subjectively adequately mineralized. The joint spaces are well maintained. There is no acute or healing fracture. There may be a small suprapatellar effusion. IMPRESSION: There is no acute bony abnormality of the left knee. There may be a small suprapatellar effusion. Electronically Signed   By: David  SwazilandJordan M.D.   On: 12/11/2016 12:49    Positive ROS: All other systems have  been reviewed and were otherwise negative with the exception of those mentioned in the HPI and as above.  Physical Exam: General: Alert, no acute distress Cardiovascular: No pedal edema Respiratory: No cyanosis, no use of accessory musculature GI: No organomegaly, abdomen is soft and non-tender Skin: No lesions in the area of chief complaint Neurologic: Sensation intact distally Psychiatric: Patient is competent for consent with normal mood and affect Lymphatic: No axillary or cervical lymphadenopathy  MUSCULOSKELETAL: Examination of the lumbar spine reveals no tenderness to palpation.  Examination of the left lower  extremity reveals no skin wounds or lesions. She has swelling and an effusion to the left knee. She has global diffuse tenderness to palpation about the left knee. She can perform a straight leg raise with no lag. She can flex to about 60, with further flexion limited by knee pain. There is no pain with logrolling of the hip. She does have some pedal edema. She has palpable pedal pulses. There is no focal motor or sensory deficit.  Assessment: Subacute MSSA septic arthritis of the left knee IV drug abuse Opioid dependence Tobacco abuse  Plan: I discussed the findings with the patient. Unfortunately her infection has present for at least 8 days at this point. She may develop early onset of knee arthritis due to cartilage damage from the infection. Nonetheless, I recommended open arthrotomy and drainage of her left knee. We discussed the risks, benefits, and alternatives to surgery. I would recommend infectious disease consult for antibiotic selection and duration given the complexity of her infectious issues. Continue nothing by mouth. We will plan for surgery today.   The risks, benefits, and alternatives were discussed with the patient. There are risks associated with the surgery including, but not limited to, problems with anesthesia (death), reoperation, recurrent infection, instability (giving out of the joint), hematoma (blood accumulation) which may require surgical drainage, stiffness, chronic pain, blood clots, pulmonary embolism, nerve injury (foot drop), and blood vessel injury. The patient understands these risks and elects to proceed.   Marletta Bousquet, Cloyde Reams, MD Cell [phone]    12/11/2016 2:35 PM

## 2016-12-11 NOTE — Transfer of Care (Signed)
Immediate Anesthesia Transfer of Care Note  Patient: Gabriella Ramos  Procedure(s) Performed: Procedure(s): IRRIGATION AND DEBRIDEMENT KNEE AND SYNOVECTOMY (Left)  Patient Location: PACU  Anesthesia Type:General  Level of Consciousness: awake  Airway & Oxygen Therapy: Patient Spontanous Breathing and Patient connected to nasal cannula oxygen  Post-op Assessment: Report given to RN and Post -op Vital signs reviewed and stable  Post vital signs: Reviewed and stable  Last Vitals:  Vitals:   12/11/16 1723 12/11/16 1932  BP: (!) 146/99   Pulse: 97 (!) 112  Resp: 18 20  Temp: 37.4 C 36.2 C    Last Pain:  Vitals:   12/11/16 1932  TempSrc:   PainSc: (P) Asleep      Patients Stated Pain Goal: 0 (12/11/16 0515)  Complications: No apparent anesthesia complications

## 2016-12-11 NOTE — Brief Op Note (Signed)
12/11/2016  7:20 PM  PATIENT:  Gabriella Ramos  41 y.o. female  PRE-OPERATIVE DIAGNOSIS:  SEPTIC ARTHRITIS LEFT KNEE  POST-OPERATIVE DIAGNOSIS:  SEPTIC ARTHRITIS LEFT KNEE  PROCEDURE:  Procedure(s): IRRIGATION AND DEBRIDEMENT KNEE AND SYNOVECTOMY (Left)  SURGEON:  Surgeon(s) and Role:    * Samson FredericBrian Marylyn Appenzeller, MD - Primary  PHYSICIAN ASSISTANT:   ASSISTANTS: April green, rnfa   ANESTHESIA:   general  EBL:  Total I/O In: -  Out: 50 [Blood:50]  BLOOD ADMINISTERED:none  DRAINS: (medium) Hemovact drain(s) in the left knee with  Suction Open   LOCAL MEDICATIONS USED:  NONE  SPECIMEN:  Source of Specimen:  left knee synovial fluid for cell count w/ diff & culture, synovium for tissue culture  DISPOSITION OF SPECIMEN:  micro  COUNTS:  YES  TOURNIQUET:    DICTATION: .Other Dictation: Dictation Number [id number]  PLAN OF CARE: Admit to inpatient   PATIENT DISPOSITION:  PACU - hemodynamically stable.   Delay start of Pharmacological VTE agent (>24hrs) due to surgical blood loss or risk of bleeding: not applicable

## 2016-12-11 NOTE — Consult Note (Signed)
 ORTHOPAEDIC CONSULTATION  REQUESTING PHYSICIAN: Gokul Krishnan, MD  PCP:  No primary care provider on file.  Chief Complaint: MSSA septic arthritis, left knee  HPI: Gabriella Ramos is a 41 y.o. female who was hospitalized at [facility] about 2 weeks ago due to MSSA bacteremia and septic pulmonary emboli. PICC line was placed, and then she was discharged to [facility]. The records indicate that she was having knee pain and swelling, and on 12/03/2016 her left knee was aspirated by PM&R, and the cultures are growing MSSA. She was transferred to [place] last night. Orthopedic consultation was placed this morning. The patient complains of left knee pain and swelling. She does not remember the exact duration of time that her left knee has hurt. She says that her left knee pain is 10/10. She also complains of buttock pain that has been present since she fell on the ice and landed on her but.  Past Medical History:  Diagnosis Date  . Chronic pain syndrome   . Depression with anxiety   . IVDU (intravenous drug user)   . Tobacco abuse    Past Surgical History:  Procedure Laterality Date  . APPENDECTOMY    . CESAREAN SECTION    . KIDNEY SURGERY     Social History   Social History  . Marital status: Married    Spouse name: N/A  . Number of children: N/A  . Years of education: N/A   Social History Main Topics  . Smoking status: None  . Smokeless tobacco: None  . Alcohol use None  . Drug use: Unknown  . Sexual activity: Not Asked   Other Topics Concern  . None   Social History Narrative  . None   Family History  Problem Relation Age of Onset  . Alcoholism Mother    Allergies  Allergen Reactions  . Ketorolac Tromethamine Shortness Of Breath  . Sulfa Antibiotics    Prior to Admission medications   Medication Sig Start Date End Date Taking? Authorizing Provider  ALPRAZolam (XANAX) 1 MG tablet Take 1 mg by mouth 3 (three) times daily. 12/06/16  Yes  Historical Provider, MD  carisoprodol (SOMA) 350 MG tablet Take 350 mg by mouth 3 (three) times daily. 11/07/16  Yes Historical Provider, MD  diphenhydrAMINE (BENADRYL) 25 mg capsule Take 25 mg by mouth every 6 (six) hours as needed for itching.   Yes Historical Provider, MD  ibuprofen (ADVIL,MOTRIN) 200 MG tablet Take 200 mg by mouth every 6 (six) hours as needed for moderate pain.   Yes Historical Provider, MD  Menthol, Topical Analgesic, (ICY HOT EX) Apply 1 application topically daily as needed (pain).   Yes Historical Provider, MD  oxymorphone (OPANA ER) 20 MG 12 hr tablet Take 20 mg by mouth every 12 (twelve) hours.   Yes Historical Provider, MD   Dg Knee 1-2 Views Left  Result Date: 12/11/2016 CLINICAL DATA:  Status post fall in the snow 2 weeks ago. The patient ports pain radiating from the knee proximally to the hip. EXAM: LEFT KNEE - 1-2 VIEW COMPARISON:  None in PACs FINDINGS: The bones are subjectively adequately mineralized. The joint spaces are well maintained. There is no acute or healing fracture. There may be a small suprapatellar effusion. IMPRESSION: There is no acute bony abnormality of the left knee. There may be a small suprapatellar effusion. Electronically Signed   By: David  Jordan M.D.   On: 12/11/2016 12:49    Positive ROS: All other systems have   been reviewed and were otherwise negative with the exception of those mentioned in the HPI and as above.  Physical Exam: General: Alert, no acute distress Cardiovascular: No pedal edema Respiratory: No cyanosis, no use of accessory musculature GI: No organomegaly, abdomen is soft and non-tender Skin: No lesions in the area of chief complaint Neurologic: Sensation intact distally Psychiatric: Patient is competent for consent with normal mood and affect Lymphatic: No axillary or cervical lymphadenopathy  MUSCULOSKELETAL: Examination of the lumbar spine reveals no tenderness to palpation.  Examination of the left lower  extremity reveals no skin wounds or lesions. She has swelling and an effusion to the left knee. She has global diffuse tenderness to palpation about the left knee. She can perform a straight leg raise with no lag. She can flex to about 60, with further flexion limited by knee pain. There is no pain with logrolling of the hip. She does have some pedal edema. She has palpable pedal pulses. There is no focal motor or sensory deficit.  Assessment: Subacute MSSA septic arthritis of the left knee IV drug abuse Opioid dependence Tobacco abuse  Plan: I discussed the findings with the patient. Unfortunately her infection has present for at least 8 days at this point. She may develop early onset of knee arthritis due to cartilage damage from the infection. Nonetheless, I recommended open arthrotomy and drainage of her left knee. We discussed the risks, benefits, and alternatives to surgery. I would recommend infectious disease consult for antibiotic selection and duration given the complexity of her infectious issues. Continue nothing by mouth. We will plan for surgery today.   The risks, benefits, and alternatives were discussed with the patient. There are risks associated with the surgery including, but not limited to, problems with anesthesia (death), reoperation, recurrent infection, instability (giving out of the joint), hematoma (blood accumulation) which may require surgical drainage, stiffness, chronic pain, blood clots, pulmonary embolism, nerve injury (foot drop), and blood vessel injury. The patient understands these risks and elects to proceed.   Braileigh Landenberger James, MD Cell [phone]    12/11/2016 2:35 PM  

## 2016-12-11 NOTE — Progress Notes (Signed)
[facility] PROGRESS NOTE  Gabriella Ramos RUE:[id number] DOB: 1976/07/07 DOA: 12/10/2016  PCP: No primary care provider on file.  Brief History/Interval Summary: 41 year old Caucasian female with a past medical history of polysubstance abuse, tobacco abuse, IV drug use, chronic pain syndrome, drug-seeking behavior who was hospitalized in [facility] for pneumonia and found to have bacteremia and endocarditis. She was placed on IV antibiotics. She was transferred to [facility]. She complained of left knee pain and swelling. Joint was aspirated, which revealed MSSA. Patient was transferred here to be seen by orthopedics.  Reason for Visit: Left knee joint infection  Consultants: Orthopedics  Procedures: None yet  Antibiotics: Cefazolin  Subjective/Interval History: Patient complains of pain involving her entire left leg, but mainly in the left knee. 10 out of 10 in intensity.  ROS: Denies any nausea or vomiting  Objective:  Vital Signs  Vitals:   12/11/16 0655 12/11/16 0727 12/11/16 0742 12/11/16 1028  BP: (!) 149/98 (!) 141/96 (!) 144/93 138/90  Pulse: 100 98 98 95  Resp: 20 20 20 16   Temp: 99 F (37.2 C) 99.7 F (37.6 C) 99.3 F (37.4 C) 99 F (37.2 C)  TempSrc: Oral Oral Oral Oral  SpO2: 99% 94% 98% 94%  Weight:      Height:        Intake/Output Summary (Last 24 hours) at 12/11/16 1640 Last data filed at 12/11/16 1028  Gross per 24 hour  Intake             1327 ml  Output                0 ml  Net             1327 ml   Filed Weights   12/11/16 0512  Weight: 90.3 kg (199 lb)    General appearance: alert, cooperative, appears stated age and no distress Head: Normocephalic, without obvious abnormality, atraumatic Resp: clear to auscultation bilaterally Cardio: regular rate and rhythm, S1, S2 normal, no murmur, click, rub or gallop GI: soft, non-tender; bowel sounds normal; no masses,  no organomegaly Extremities: Left knee is noted  to be swollen. Warm to touch. Restricted range of motion. Neurologic: No focal deficits  Lab Results:  Data Reviewed: I have personally reviewed following labs and imaging studies  CBC:  Recent Labs Lab 12/11/16 0023 12/11/16 0214  WBC 6.5 5.8  NEUTROABS 4.6  --   HGB 6.8* 7.3*  HCT 21.2* 23.2*  MCV 84.8 85.3  PLT 311 301    Basic Metabolic Panel:  Recent Labs Lab 12/11/16 0023 12/11/16 0214  NA 136 136  K 4.3 4.3  CL 106 105  CO2 21* 23  GLUCOSE 106* 96  BUN 17 15  CREATININE 0.97 0.98  CALCIUM 8.0* 7.9*    GFR: Estimated Creatinine Clearance: 84.7 mL/min (by C-G formula based on SCr of 0.98 mg/dL).  Coagulation Profile:  Recent Labs Lab 12/11/16 0023  INR 1.28    CBG:  Recent Labs Lab 12/11/16 1125  GLUCAP 85     Recent Results (from the past 240 hour(s))  Culture, blood (Routine X 2) w Reflex to ID Panel     Status: None (Preliminary result)   Collection Time: 12/11/16 12:23 AM  Result Value Ref Range Status   Specimen Description BLOOD LEFT ANTECUBITAL  Final   Special Requests IN PEDIATRIC BOTTLE [facility]  Final   Culture PENDING  Incomplete   Report Status PENDING  Incomplete  Radiology Studies: Dg Knee 1-2 Views Left  Result Date: 12/11/2016 CLINICAL DATA:  Status post fall in the snow 2 weeks ago. The patient ports pain radiating from the knee proximally to the hip. EXAM: LEFT KNEE - 1-2 VIEW COMPARISON:  None in PACs FINDINGS: The bones are subjectively adequately mineralized. The joint spaces are well maintained. There is no acute or healing fracture. There may be a small suprapatellar effusion. IMPRESSION: There is no acute bony abnormality of the left knee. There may be a small suprapatellar effusion. Electronically Signed   By: David  SwazilandJordan M.D.   On: 12/11/2016 12:49     Medications:  Scheduled: . sodium chloride   Intravenous Once  . amitriptyline  25 mg Oral QHS  .  ceFAZolin (ANCEF) IV  2 g Intravenous Q8H  .  chlorhexidine  60 mL Topical Once  . docusate sodium  100 mg Oral Daily  . DULoxetine  30 mg Oral Daily  . famotidine  20 mg Oral BID  . ferrous sulfate  325 mg Oral Q breakfast  . gabapentin  600 mg Oral TID  . methadone  10 mg Oral Q12H  . nicotine  21 mg Transdermal Daily  . sodium chloride flush  3 mL Intravenous Q12H   Continuous: . sodium chloride 100 mL/hr at 12/11/16 1453   JWJ:XBJYNWGNFAOZHPRN:acetaminophen, clonazePAM, dextromethorphan-guaiFENesin, ondansetron, polyethylene glycol, sodium chloride flush, zolpidem  Assessment/Plan:  Principal Problem:   Septic arthritis of knee, left (HCC) Active Problems:   Tobacco abuse   IVDU (intravenous drug user)   Depression with anxiety   Bacterial endocarditis   Normocytic anemia   Polysubstance abuse   Constipation   Chronic pain syndrome   GERD (gastroesophageal reflux disease)    Septic arthritis of knee, left:  Patient is not septic. Lactate is 0.7. Hemodynamically stable. Patient was continued on IV cefazolin. Follow up on cultures. Discussed with orthopedics this morning. Plan is for washout of the joint later today. Will likely need to invoke ADD. Can wait till tomorrow to do so. Continue methadone for pain.  Normocytic anemia Pt was transfused with one units of blood on 12/09/16. Her Hgb was 7.9 on 12/10/16. Her hgb dropped again to 6.8 2/6. etiology is not clear. No overt bleeding has been noted. She was transfused another unit. Monitor hemoglobin closely. Check LFTs. LDH is normal.  Bacterial endocarditis: [facility]. Patient is on IV cefazolin  Polysubstance abuse, IVDU and Tobacco abuse -Did counseling about importance of quitting substance -Nicotine patch -check UDS  Depression and anxiety Stable, no suicidal or homicidal ideations. -Continue home medications: Amitriptyline, Cymbalta, clonopin  Constipation -Miralax and docusate  Chronic pain  syndrome -methadone  GERD: -Pepcid   DVT Prophylaxis: SCDs    Code Status: Full code  Family Communication: Discussed with patient  Disposition Plan: Will return to [facility] once cleared by orthopedics.    LOS: 1 day   [facility] Valley HospitalKRISHNAN,Derek Huneycutt  [facility] Pager [phone] 12/11/2016, 4:40 PM  If 7PM-7AM, please contact night-coverage at [email or web address], password [email or web address]

## 2016-12-12 ENCOUNTER — Inpatient Hospital Stay (HOSPITAL_COMMUNITY): Payer: Medicaid Other

## 2016-12-12 ENCOUNTER — Encounter (HOSPITAL_COMMUNITY): Payer: Self-pay | Admitting: Orthopedic Surgery

## 2016-12-12 DIAGNOSIS — M545 Low back pain, unspecified: Secondary | ICD-10-CM

## 2016-12-12 DIAGNOSIS — A4101 Sepsis due to Methicillin susceptible Staphylococcus aureus: Secondary | ICD-10-CM

## 2016-12-12 DIAGNOSIS — Z885 Allergy status to narcotic agent status: Secondary | ICD-10-CM

## 2016-12-12 DIAGNOSIS — Z9181 History of falling: Secondary | ICD-10-CM

## 2016-12-12 DIAGNOSIS — R7881 Bacteremia: Secondary | ICD-10-CM

## 2016-12-12 DIAGNOSIS — M25559 Pain in unspecified hip: Secondary | ICD-10-CM

## 2016-12-12 DIAGNOSIS — Z95828 Presence of other vascular implants and grafts: Secondary | ICD-10-CM

## 2016-12-12 DIAGNOSIS — M00062 Staphylococcal arthritis, left knee: Principal | ICD-10-CM

## 2016-12-12 DIAGNOSIS — B9561 Methicillin susceptible Staphylococcus aureus infection as the cause of diseases classified elsewhere: Secondary | ICD-10-CM

## 2016-12-12 DIAGNOSIS — I2690 Septic pulmonary embolism without acute cor pulmonale: Secondary | ICD-10-CM

## 2016-12-12 DIAGNOSIS — Z882 Allergy status to sulfonamides status: Secondary | ICD-10-CM

## 2016-12-12 DIAGNOSIS — Z720 Tobacco use: Secondary | ICD-10-CM

## 2016-12-12 DIAGNOSIS — Z811 Family history of alcohol abuse and dependence: Secondary | ICD-10-CM

## 2016-12-12 DIAGNOSIS — M461 Sacroiliitis, not elsewhere classified: Secondary | ICD-10-CM

## 2016-12-12 DIAGNOSIS — F1910 Other psychoactive substance abuse, uncomplicated: Secondary | ICD-10-CM

## 2016-12-12 DIAGNOSIS — Z9089 Acquired absence of other organs: Secondary | ICD-10-CM

## 2016-12-12 DIAGNOSIS — I330 Acute and subacute infective endocarditis: Secondary | ICD-10-CM

## 2016-12-12 DIAGNOSIS — Z9889 Other specified postprocedural states: Secondary | ICD-10-CM

## 2016-12-12 LAB — COMPREHENSIVE METABOLIC PANEL
ALT: <5 U/L — ABNORMAL LOW (ref 14–54)
ANION GAP: 8 (ref 5–15)
AST: 10 U/L — ABNORMAL LOW (ref 15–41)
Albumin: 1.5 g/dL — ABNORMAL LOW (ref 3.5–5.0)
Alkaline Phosphatase: 105 U/L (ref 38–126)
BILIRUBIN TOTAL: 0.2 mg/dL — AB (ref 0.3–1.2)
BUN: 11 mg/dL (ref 6–20)
CALCIUM: 7.9 mg/dL — AB (ref 8.9–10.3)
CO2: 22 mmol/L (ref 22–32)
Chloride: 107 mmol/L (ref 101–111)
Creatinine, Ser: 0.87 mg/dL (ref 0.44–1.00)
GFR calc non Af Amer: >60 mL/min (ref >60)
Glucose, Bld: 91 mg/dL (ref 65–99)
Potassium: 4.5 mmol/L (ref 3.5–5.1)
Sodium: 137 mmol/L (ref 135–145)
TOTAL PROTEIN: 5.7 g/dL — AB (ref 6.5–8.1)

## 2016-12-12 LAB — TYPE AND SCREEN
Blood Product Expiration Date: 201803052359
ISSUE DATE / TIME: 201802070634
Unit Type and Rh: 5100

## 2016-12-12 LAB — CBC
HEMATOCRIT: 25.4 % — AB (ref 36.0–46.0)
HEMOGLOBIN: 8 g/dL — AB (ref 12.0–15.0)
MCH: 27.2 pg (ref 26.0–34.0)
MCHC: 31.5 g/dL (ref 30.0–36.0)
MCV: 86.4 fL (ref 78.0–100.0)
Platelets: 312 10*3/uL (ref 150–400)
RBC: 2.94 MIL/uL — ABNORMAL LOW (ref 3.87–5.11)
RDW: 15 % (ref 11.5–15.5)
WBC: 5.5 10*3/uL (ref 4.0–10.5)

## 2016-12-12 LAB — GLUCOSE, CAPILLARY: GLUCOSE-CAPILLARY: 99 mg/dL (ref 65–99)

## 2016-12-12 LAB — HAPTOGLOBIN: HAPTOGLOBIN: 286 mg/dL — AB (ref 34–200)

## 2016-12-12 MED ORDER — HYDROMORPHONE HCL 2 MG PO TABS
2.0000 mg | ORAL_TABLET | ORAL | Status: DC | PRN
  Administered 2016-12-13 (×2): 2 mg via ORAL
  Filled 2016-12-12 (×2): qty 1

## 2016-12-12 NOTE — Anesthesia Postprocedure Evaluation (Signed)
Anesthesia Post Note  Patient: Gabriella LeachRachel Lynn Ramos  Procedure(s) Performed: Procedure(s) (LRB): IRRIGATION AND DEBRIDEMENT KNEE AND SYNOVECTOMY (Left)  Patient location during evaluation: PACU Anesthesia Type: General Level of consciousness: awake and alert Pain management: pain level controlled Vital Signs Assessment: post-procedure vital signs reviewed and stable Respiratory status: spontaneous breathing, nonlabored ventilation, respiratory function stable and patient connected to nasal cannula oxygen Cardiovascular status: blood pressure returned to baseline and stable Postop Assessment: no signs of nausea or vomiting Anesthetic complications: no       Last Vitals:  Vitals:   12/11/16 2015 12/11/16 2040  BP: (!) 156/107 (!) 148/108  Pulse: (!) 107 (!) 106  Resp: (!) 8 13  Temp: 36.3 C 36.8 C    Last Pain:  Vitals:   12/12/16 0430  TempSrc:   PainSc: 10-Worst pain ever                 [name] DAVID

## 2016-12-12 NOTE — Evaluation (Signed)
Physical Therapy Evaluation Patient Details Name: Gabriella MendsRachel Lynn Ferrera MRN: [id number] DOB: January 03, 1976 Today's Date: 12/12/2016   History of Present Illness  41 year old Caucasian female with a past medical history of polysubstance abuse, tobacco abuse, IV drug use, chronic pain syndrome, drug-seeking behavior who was [facility] and found to have bacteremia and endocarditis. She was placed on IV antibiotics. [facility]. She complained of left knee pain and swelling. Joint was aspirated, which revealed MSSA.. Transferred to [facility]. Underwent I & D L knee 2/7.   Clinical Impression  Pt admitted with/for complications/pain above.  Pt needs significant assist and is somewhat limited by the pain at this point..  Pt currently limited functionally due to the problems listed. ( See problems list.)   Pt will benefit from PT to maximize function and safety in order to get ready for next venue listed below.     Follow Up Recommendations SNF    Equipment Recommendations  Other (comment) (TBA)    Recommendations for Other Services       Precautions / Restrictions Precautions Precautions: Fall Restrictions Weight Bearing Restrictions: No      Mobility  Bed Mobility Overal bed mobility: Needs Assistance Bed Mobility: Supine to Sit;Sit to Supine     Supine to sit: Mod assist;+2 for physical assistance Sit to supine: Mod assist;+2 for physical assistance   General bed mobility comments: increased time and encouragement. A with LLE  Transfers Overall transfer level: Needs assistance Equipment used: Rolling walker (2 wheeled) Transfers: Sit to/from Stand Sit to Stand: Mod assist;+2 physical assistance         General transfer comment: ! person requried to hold LLE  Ambulation/Gait             General Gait Details: unable  Stairs            Wheelchair Mobility    Modified Rankin (Stroke Patients Only)        Balance Overall balance assessment: Needs assistance   Sitting balance-Leahy Scale: Good     Standing balance support: Bilateral upper extremity supported Standing balance-Leahy Scale: Poor Standing balance comment: reliant heavily on the RW                             Pertinent Vitals/Pain Pain Assessment: 0-10 Pain Score:  (pt stated "12") Pain Location: LLE Pain Descriptors / Indicators: Constant;Crushing;Crying;Grimacing;Guarding;Moaning;Stabbing Pain Intervention(s): Limited activity within patient's tolerance    Home Living Family/patient expects to be discharged to:: Skilled nursing facility                      Prior Function Level of Independence: Needs assistance   Gait / Transfers Assistance Needed: pt states she was using RW at facility until her knee started bothering her  ADL's / Homemaking Assistance Needed: stated she was completing most of her ADL        Hand Dominance   Dominant Hand: Right    Extremity/Trunk Assessment        Lower Extremity Assessment Lower Extremity Assessment: LLE deficits/detail;RLE deficits/detail RLE Deficits / Details: generally WFL LLE Deficits / Details: pt unable to bear ROM or any MMT, she moved the limb manually by herself. LLE Coordination: decreased fine motor;decreased gross motor    Cervical / Trunk Assessment Cervical / Trunk Assessment: Normal  Communication   Communication: No difficulties  Cognition Arousal/Alertness: Awake/alert Behavior During Therapy: Restless;Anxious;Agitated;Impulsive  Overall Cognitive Status: No family/caregiver present to determine baseline cognitive functioning (most likely baseline)                      General Comments      Exercises     Assessment/Plan    PT Assessment Patient needs continued PT services  PT Problem List Decreased strength;Decreased range of motion;Decreased activity tolerance;Decreased mobility;Decreased knowledge of  use of DME;Decreased knowledge of precautions;Pain          PT Treatment Interventions DME instruction;Gait training;Functional mobility training;Therapeutic activities;Therapeutic exercise;Balance training;Patient/family education    PT Goals (Current goals can be found in the Care Plan section)  Acute Rehab PT Goals Patient Stated Goal: to not be in pain PT Goal Formulation: With patient Time For Goal Achievement: 12/26/16 Potential to Achieve Goals: Good    Frequency Min 3X/week   Barriers to discharge        Co-evaluation PT/OT/SLP Co-Evaluation/Treatment: Yes Reason for Co-Treatment: Complexity of the patient's impairments (multi-system involvement) PT goals addressed during session: Mobility/safety with mobility         End of Session   Activity Tolerance: Patient limited by pain Patient left: in bed;with call bell/phone within reach;with bed alarm set Nurse Communication: Mobility status         Time: 1610-9604 PT Time Calculation (min) (ACUTE ONLY): 28 min   Charges:   PT Evaluation $PT Eval Moderate Complexity: 1 Procedure     PT G CodesEliseo Gum Michaeljohn Biss 12/12/2016, 6:01 PM 12/12/2016  Witmer Bing, PT [phone]  (pager)

## 2016-12-12 NOTE — Progress Notes (Signed)
Occupational Therapy Evaluation Patient Details Name: Gabriella MendsRachel Lynn Georg MRN: [id number] DOB: May 31, 1976 Today's Date: 12/12/2016    History of Present Illness 41 year old Caucasian female with a past medical history of polysubstance abuse, tobacco abuse, IV drug use, chronic pain syndrome, drug-seeking behavior who was hospitalized in [facility] for pneumonia and found to have bacteremia and endocarditis. She was placed on IV antibiotics. She was transferred to [facility]. She complained of left knee pain and swelling. Joint was aspirated, which revealed MSSA.. Transferred to [facility]. Underwent I & D L knee 2/7.    Clinical Impression   PTA, pt was at SNF due to recent hospitalization. Prior to SNF, pt was independent with ADL and mobility. Pt currently limited by pain, although premedicated prior to session. Pt currently requireis mod A with ADL and mod A +2 with mobility. Anticipate DC back to SNF for continued rehab. Will foloow acutely to address established goals.     Follow Up Recommendations  SNF;Supervision/Assistance - 24 hour    Equipment Recommendations  Other (comment) (TBA at SNF)    Recommendations for Other Services       Precautions / Restrictions Precautions Precautions: Fall Restrictions Weight Bearing Restrictions: No      Mobility Bed Mobility Overal bed mobility: Needs Assistance Bed Mobility: Supine to Sit;Sit to Supine     Supine to sit: Mod assist;+2 for physical assistance Sit to supine: Mod assist;+2 for physical assistance   General bed mobility comments: increased time and encouragement. A with LLE  Transfers Overall transfer level: Needs assistance Equipment used: Rolling walker (2 wheeled) Transfers: Sit to/from Stand Sit to Stand: Mod assist;+2 physical assistance         General transfer comment: ! person requried to hold LLE    Balance Overall balance assessment: Needs assistance   Sitting balance-Leahy Scale:  Good       Standing balance-Leahy Scale: Poor                              ADL Overall ADL's : Needs assistance/impaired         Upper Body Bathing: Set up   Lower Body Bathing: Moderate assistance;Sitting/lateral leans   Upper Body Dressing : Set up   Lower Body Dressing: Moderate assistance;Sitting/lateral leans Lower Body Dressing Details (indicate cue type and reason): Pt crossed R leg over L knee to donn sock. A for L foot             Functional mobility during ADLs: +2 for physical assistance;Minimal assistance General ADL Comments: Pt moaning,crying and cussing throughout session. Bed flattened and pt began moaning due to LLE being extended. Pt changed bed position to increase comfort. Pt educated on need to increase L knee extension to reduce pain with mobility. Offered BSC however, pt declined. Pt reqrueid max encouragement to move to EOB. +2 A requried to manage LLE and assist wtih standing. Pt took 2 small steps toward HOB.     Vision     Perception     Praxis      Pertinent Vitals/Pain Pain Assessment: 0-10 (12) Pain Score:  (12) Pain Location: LLE Pain Descriptors / Indicators: Constant;Crushing;Crying;Grimacing;Guarding;Moaning;Stabbing Pain Intervention(s): Limited activity within patient's tolerance;Repositioned;Premedicated before session     Hand Dominance Right   Extremity/Trunk Assessment Upper Extremity Assessment Upper Extremity Assessment: Overall WFL for tasks assessed   Lower Extremity Assessment Lower Extremity Assessment: Defer to PT evaluation   Cervical / Trunk  Assessment Cervical / Trunk Assessment: Normal   Communication     Cognition Arousal/Alertness: Awake/alert Behavior During Therapy: Restless;Anxious;Agitated;Impulsive Overall Cognitive Status: No family/caregiver present to determine baseline cognitive functioning (most likely baseline)                     General Comments       Exercises        Shoulder Instructions      Home Living Family/patient expects to be discharged to:: Skilled nursing facility                                        Prior Functioning/Environment Level of Independence: Needs assistance  Gait / Transfers Assistance Needed: pt states she was using RW at facility until her knee started bothering her ADL's / Homemaking Assistance Needed: stated she was completing most of her ADL            OT Problem List: Decreased strength;Decreased range of motion;Decreased activity tolerance;Impaired balance (sitting and/or standing);Decreased knowledge of use of DME or AE;Obesity;Pain;Increased edema   OT Treatment/Interventions: Self-care/ADL training;Therapeutic exercise;DME and/or AE instruction;Therapeutic activities;Cognitive remediation/compensation;Patient/family education;Balance training    OT Goals(Current goals can be found in the care plan section) Acute Rehab OT Goals Patient Stated Goal: to not be in pain OT Goal Formulation: With patient Time For Goal Achievement: 12/26/16 Potential to Achieve Goals: Good ADL Goals Pt Will Perform Lower Body Bathing: with adaptive equipment;sit to/from stand;with set-up;with supervision Pt Will Perform Lower Body Dressing: with adaptive equipment;sit to/from stand;with supervision;with set-up Pt Will Transfer to Toilet: with supervision;ambulating;bedside commode Pt Will Perform Toileting - Clothing Manipulation and hygiene: with modified independence;sitting/lateral leans  OT Frequency: Min 2X/week   Barriers to D/C:            Co-evaluation PT/OT/SLP Co-Evaluation/Treatment: Yes Reason for Co-Treatment: Complexity of the patient's impairments (multi-system involvement);Necessary to address cognition/behavior during functional activity;For patient/therapist safety;To address functional/ADL transfers   OT goals addressed during session: ADL's and self-care      End of Session Equipment  Utilized During Treatment: Gait belt;Rolling walker Nurse Communication: Mobility status;Patient requests pain meds;Weight bearing status  Activity Tolerance: Patient limited by pain Patient left: in bed;with call bell/phone within reach;with bed alarm set   Time: [phone] OT Time Calculation (min): 31 min Charges:  OT General Charges $OT Visit: 1 Procedure OT Evaluation $OT Eval Moderate Complexity: 1 Procedure G-Codes:    Mohmed Farver,HILLARY 01/04/17, 2:04 PM   [facility], OT/L  [phone] 04-Jan-2017

## 2016-12-12 NOTE — Progress Notes (Signed)
[facility] PROGRESS NOTE  Pearson Reasons Belgard ZOX:[id number] DOB: [birth year] DOA: 12/10/2016  PCP: No primary care provider on file.  Brief History/Interval Summary: 41 year old Caucasian female with a past medical history of polysubstance abuse, tobacco abuse, IV drug use, chronic pain syndrome, drug-seeking behavior who was hospitalized in [facility] for pneumonia and found to have bacteremia and endocarditis. She was placed on IV antibiotics. She was transferred to [facility]. She complained of left knee pain and swelling. Joint was aspirated, which revealed MSSA. Patient was transferred here to be seen by orthopedics.  Reason for Visit: Left knee joint infection  Consultants: Orthopedics. Infectious disease.  Procedures:  2/7: 1. Open arthrotomy and drainage of left knee. 2. Synovectomy of left knee.  Antibiotics: Cefazolin  Subjective/Interval History: Patient continues to have pain involving her entire left leg. She also tells me she has pain in the left hip and some in her buttock area on the left side. 10 out of 10 in intensity.  ROS: Denies any nausea or vomiting  Objective:  Vital Signs  Vitals:   12/11/16 2000 12/11/16 2015 12/11/16 2040 12/12/16 0612  BP: (!) 147/105 (!) 156/107 (!) 148/108 (!) 130/95  Pulse: (!) 109 (!) 107 (!) 106 (!) 106  Resp: 11 (!) 8 13 18   Temp:  97.3 F (36.3 C) 98.2 F (36.8 C) 98.6 F (37 C)  TempSrc:   Oral Oral  SpO2: 98% 98% 98% 92%  Weight:   89.4 kg (197 lb 1.5 oz)   Height:        Intake/Output Summary (Last 24 hours) at 12/12/16 0920 Last data filed at 12/12/16 8119  Gross per 24 hour  Intake          1728.25 ml  Output              275 ml  Net          1453.25 ml   Filed Weights   12/11/16 0512 12/11/16 1752 12/11/16 2040  Weight: 90.3 kg (199 lb) 90.3 kg (199 lb) 89.4 kg (197 lb 1.5 oz)    General appearance: alert, cooperative, appears stated age and no distress Resp: clear to auscultation  bilaterally Cardio: regular rate and rhythm, S1, S2 normal, no murmur, click, rub or gallop GI: soft, non-tender; bowel sounds normal; no masses,  no organomegaly Extremities: Left lower extremity is covered in a dressing. Able to move her toes. Neurologic: No focal deficits  Lab Results:  Data Reviewed: I have personally reviewed following labs and imaging studies  CBC:  Recent Labs Lab 12/11/16 0023 12/11/16 0214 12/12/16 0424  WBC 6.5 5.8 5.5  NEUTROABS 4.6  --   --   HGB 6.8* 7.3* 8.0*  HCT 21.2* 23.2* 25.4*  MCV 84.8 85.3 86.4  PLT 311 301 312    Basic Metabolic Panel:  Recent Labs Lab 12/11/16 0023 12/11/16 0214 12/12/16 0424  NA 136 136 137  K 4.3 4.3 4.5  CL 106 105 107  CO2 21* 23 22  GLUCOSE 106* 96 91  BUN 17 15 11   CREATININE 0.97 0.98 0.87  CALCIUM 8.0* 7.9* 7.9*    GFR: Estimated Creatinine Clearance: 94.9 mL/min (by C-G formula based on SCr of 0.87 mg/dL).  Coagulation Profile:  Recent Labs Lab 12/11/16 0023  INR 1.28    CBG:  Recent Labs Lab 12/11/16 1125 12/12/16 0804  GLUCAP 85 99     Recent Results (from the past 240 hour(s))  Culture, blood (Routine X 2)  w Reflex to ID Panel     Status: None (Preliminary result)   Collection Time: 12/11/16 12:23 AM  Result Value Ref Range Status   Specimen Description BLOOD LEFT ANTECUBITAL  Final   Special Requests IN PEDIATRIC BOTTLE [facility]  Final   Culture PENDING  Incomplete   Report Status PENDING  Incomplete  Surgical PCR screen     Status: None   Collection Time: 12/11/16  5:13 PM  Result Value Ref Range Status   MRSA, PCR NEGATIVE NEGATIVE Final   Staphylococcus aureus NEGATIVE NEGATIVE Final    Comment:        The Xpert SA Assay (FDA approved for NASAL specimens in patients over 19 years of age), is one component of a comprehensive surveillance program.  Test performance has been validated by [facility] for patients greater than or equal to 26 year old. It is not  intended to diagnose infection nor to guide or monitor treatment.   Aerobic/Anaerobic Culture (surgical/deep wound)     Status: None (Preliminary result)   Collection Time: 12/11/16  7:12 PM  Result Value Ref Range Status   Specimen Description TISSUE LEFT KNEE  Final   Special Requests SYNOVIAL  Final   Gram Stain   Final    ABUNDANT WBC PRESENT,BOTH PMN AND MONONUCLEAR NO ORGANISMS SEEN    Culture PENDING  Incomplete   Report Status PENDING  Incomplete      Radiology Studies: Dg Knee 1-2 Views Left  Result Date: 12/11/2016 CLINICAL DATA:  Status post fall in the snow 2 weeks ago. The patient ports pain radiating from the knee proximally to the hip. EXAM: LEFT KNEE - 1-2 VIEW COMPARISON:  None in PACs FINDINGS: The bones are subjectively adequately mineralized. The joint spaces are well maintained. There is no acute or healing fracture. There may be a small suprapatellar effusion. IMPRESSION: There is no acute bony abnormality of the left knee. There may be a small suprapatellar effusion. Electronically Signed   By: David  [place] M.D.   On: 12/11/2016 12:49     Medications:  Scheduled: . amitriptyline  25 mg Oral QHS  .  ceFAZolin (ANCEF) IV  2 g Intravenous Q8H  . docusate sodium  100 mg Oral Daily  . DULoxetine  30 mg Oral Daily  . enoxaparin (LOVENOX) injection  40 mg Subcutaneous Q24H  . famotidine  20 mg Oral BID  . gabapentin  600 mg Oral TID  . morphine  60 mg Oral Q12H  . nicotine  21 mg Transdermal Daily  . sodium chloride flush  3 mL Intravenous Q12H   Continuous: . sodium chloride 75 mL/hr at 12/11/16 2259   ZOX:[id number], clonazePAM, dextromethorphan-guaiFENesin, HYDROmorphone (DILAUDID) injection, HYDROmorphone, methocarbamol **OR** methocarbamol (ROBAXIN)  IV, metoCLOPramide **OR** metoCLOPramide (REGLAN) injection, ondansetron, polyethylene glycol, sodium chloride flush, zolpidem  Assessment/Plan:  Principal Problem:   Septic arthritis of knee, left  (HCC) Active Problems:   Tobacco abuse   IVDU (intravenous drug user)   Depression with anxiety   Bacterial endocarditis   Normocytic anemia   Polysubstance abuse   Constipation   Chronic pain syndrome   GERD (gastroesophageal reflux disease)    Septic arthritis of knee, left:  Patient is not septic. Lactate is 0.7. Hemodynamically stable. Patient was continued on IV cefazolin. Follow up on cultures. She'll seen by orthopedics. Patient underwent arthrotomy and drainage of the left knee. Elevated WBC noted in the fluid. Cultures are pending. Patient also has pain in her left hip as well  as back. MRI of the left hip and lumbar spine has been ordered. Also discussed with the Dr. Zenaida NieceVan dam with infectious disease. He will consult on this patient. Pain medications changed by orthopedics. Avoid sedation.   Normocytic anemia Pt was transfused with one units of blood on 12/09/16. Her Hgb was 7.9 on 12/10/16. Her hgb dropped again to 6.8 on 2/6. Etiology is not clear. No overt bleeding has been noted. She was transfused another unit. Monitor hemoglobin closely. Bilirubin is normal. LDH is normal.  Bacterial endocarditis involving tricuspid valve This was diagnosed at [facility]. Patient is on IV cefazolin  Polysubstance abuse, IVDU and Tobacco abuse -Counseled about importance of quitting substance -Nicotine patch  Depression and anxiety Continue home medications: Amitriptyline, Cymbalta, Klonopin  Constipation -Miralax and docusate  Chronic pain syndrome Recently started on methadone at [facility]. Patient is not experiencing much relief from this medication. Stopped by orthopedics. Started on long-acting morphine.  GERD: -Pepcid  DVT Prophylaxis: SCDs    Code Status: Full code  Family Communication: Discussed with patient  Disposition Plan: Will return to [facility] once cleared by orthopedics.    LOS: 2 days   [facility],Aneesa Romey  [facility] Pager [phone] 12/12/2016,  9:20 AM  If 7PM-7AM, please contact night-coverage at [email or web address], password [email or web address]

## 2016-12-12 NOTE — Op Note (Signed)
NAME:  Gabriella Ramos, Gabriella Ramos           ACCOUNT NO.:  [id number]  MEDICAL RECORD NO.:  [id number]  LOCATION:                                 FACILITY:  PHYSICIAN:  Gabriella Frederic, MD     DATE OF BIRTH:  [birth year]  DATE OF PROCEDURE:  12/11/2016 DATE OF DISCHARGE:                              OPERATIVE REPORT   PREOPERATIVE DIAGNOSIS:  Septic arthritis, left knee.  POSTOPERATIVE DIAGNOSIS:  Septic arthritis, left knee.  PROCEDURES PERFORMED: 1. Open arthrotomy and drainage of left knee. 2. Synovectomy of left knee.  SURGEON:  Gabriella Frederic, MD  ASSISTANT:  April Green, RNFA.  EBL:  50 mL.  ANESTHESIA:  General.  COMPLICATIONS:  None.  TUBES AND DRAINS:  Medium Hemovac x1.  SPECIMENS: 1. Left knee synovial fluid for cell count with differential and     crystal ID. 2. Left knee synovial fluid for Gram stain and culture. 3. Left knee synovial tissue for tissue culture.  ANTIBIOTICS:  2 g of Ancef.  INDICATIONS:  The patient is a 41 year old female with a history of IV drug abuse, who was recently hospitalized at [facility] for septic endocarditis, septic pulmonary emboli.  The history from her is somewhat unclear.  At some point, she started developing left knee pain. When she was at [facility], receiving IV antibiotics through PICC line, she had increasing left knee pain.  On December 03, 2016, they aspirated her left knee and cultures grew MSSA. She was transferred yesterday, December 10, 2016, to the [facility].  I was consulted today for management of her left knee septic arthritis.  We discussed the risks, benefits, alternatives to the above- mentioned procedure, she elected to proceed.  DESCRIPTION OF PROCEDURE IN DETAIL:  I identified the patient in the holding area using 2 identifiers.  The surgical site was marked by myself.  She was taken to the operating room.  General anesthesia was induced.  She was positioned on the  operating room table.  All bony prominences were well padded.  The left lower extremity was prepped and draped in normal sterile surgical fashion.  Tourniquet was applied to the thigh, but was never used.  I began by making a limited vertical incision over the medial aspect of the patella.  I made a small medial parapatellar arthrotomy.  I took care to prevent damage to the underlying cartilage and meniscus.  Upon entering her knee, I identified cloudy joint fluid.  This was aspirated, sent for the above testing. She did have inflamed synovium about the suprapatellar pouch, the medial and lateral gutter.  I performed an excisional synovectomy with a rongeur, representative piece of synovium was sent for tissue culture. Once I had removed all the infected-looking synovium, I then thoroughly assessed her cartilage.  Surprisingly, there was no visible damage to the cartilage itself, no softening, no defects.  I then irrigated the knee with 9 L of saline.  I placed a medium Hemovac drain in the suprapatellar pouch.  I closed the arthrotomy with #1 PDS, deep dermal layer with 2-0 interrupted Monocryl, staples for skin.  Sterile dressing followed by bulky compressive wrap was applied.  The patient was then extubated, taken  to the PACU in stable condition.  Sponge, needle and instrument counts were correct at the end of the case x2.  There were no known complications.  Postoperatively, we will readmit the patient to the Hospitalist.  She may weightbear as tolerated.  We will monitor her Hemovac drain output. We will monitor her intraoperative cultures.  She will need Infectious Disease consult for antibiotic selection and duration, and it will be noted that her septic arthritis has been present for at least 8 days perhaps more somewhere along the line of 2 weeks.  I will see her in the office 2 weeks after discharge for staple removal.    ______________________________ Gabriella FredericBrian Onesimo Lingard,  MD   ______________________________ Gabriella FredericBrian Rehaan Viloria, MD    BS/MEDQ  D:  12/11/2016  T:  12/12/2016  Job:  [id number]

## 2016-12-12 NOTE — Progress Notes (Signed)
Subjective:  Patient reports pain as moderate to severe.  C/o hip and LB pain.  Objective:   VITALS:   Vitals:   12/11/16 2000 12/11/16 2015 12/11/16 2040 12/12/16 0612  BP: (!) 147/105 (!) 156/107 (!) 148/108 (!) 130/95  Pulse: (!) 109 (!) 107 (!) 106 (!) 106  Resp: 11 (!) 8 13 18   Temp:  97.3 F (36.3 C) 98.2 F (36.8 C) 98.6 F (37 C)  TempSrc:   Oral Oral  SpO2: 98% 98% 98% 92%  Weight:   89.4 kg (197 lb 1.5 oz)   Height:        NAD ABD soft Sensation intact distally Intact pulses distally Dorsiflexion/Plantar flexion intact Pain with logroll of hip  Lab Results  Component Value Date   WBC 5.5 12/12/2016   HGB 8.0 (L) 12/12/2016   HCT 25.4 (L) 12/12/2016   MCV 86.4 12/12/2016   PLT 312 12/12/2016   BMET    Component Value Date/Time   NA 137 12/12/2016 0424   K 4.5 12/12/2016 0424   CL 107 12/12/2016 0424   CO2 22 12/12/2016 0424   GLUCOSE 91 12/12/2016 0424   BUN 11 12/12/2016 0424   CREATININE 0.87 12/12/2016 0424   CALCIUM 7.9 (L) 12/12/2016 0424   GFRNONAA >60 12/12/2016 0424   GFRAA >60 12/12/2016 0424     Recent Results (from the past 240 hour(s))  Culture, blood (Routine X 2) w Reflex to ID Panel     Status: None (Preliminary result)   Collection Time: 12/11/16 12:23 AM  Result Value Ref Range Status   Specimen Description BLOOD LEFT ANTECUBITAL  Final   Special Requests IN PEDIATRIC BOTTLE [facility]  Final   Culture PENDING  Incomplete   Report Status PENDING  Incomplete  Surgical PCR screen     Status: None   Collection Time: 12/11/16  5:13 PM  Result Value Ref Range Status   MRSA, PCR NEGATIVE NEGATIVE Final   Staphylococcus aureus NEGATIVE NEGATIVE Final    Comment:        The Xpert SA Assay (FDA approved for NASAL specimens in patients over 59 years of age), is one component of a comprehensive surveillance program.  Test performance has been validated by [facility] for patients greater than or equal to 78 year old. It is not  intended to diagnose infection nor to guide or monitor treatment.   Aerobic/Anaerobic Culture (surgical/deep wound)     Status: None (Preliminary result)   Collection Time: 12/11/16  7:12 PM  Result Value Ref Range Status   Specimen Description TISSUE LEFT KNEE  Final   Special Requests SYNOVIAL  Final   Gram Stain   Final    ABUNDANT WBC PRESENT,BOTH PMN AND MONONUCLEAR NO ORGANISMS SEEN    Culture PENDING  Incomplete   Report Status PENDING  Incomplete       Assessment/Plan: 1 Day Post-Op   Principal Problem:   Septic arthritis of knee, left (HCC) Active Problems:   Tobacco abuse   IVDU (intravenous drug user)   Depression with anxiety   Bacterial endocarditis   Normocytic anemia   Polysubstance abuse   Constipation   Chronic pain syndrome   GERD (gastroesophageal reflux disease)    WBAT LLE with walker MRI L spine and L hip to r/o infection / fracture IV abx per ID D/C HV drain tomorrow Follow intraop cultures (may be (-) due to IV abx)   Dayton Kenley, Cloyde Reams 12/12/2016, 8:05 AM   Samson Frederic,  MD Cell [phone]

## 2016-12-12 NOTE — Consult Note (Addendum)
Date of Admission:  12/10/2016  Date of Consult:  12/12/2016  Reason for Consult: Septic arthritis inpatient being treated for methicillin sensitive tricuspid valve endocarditis Referring Physician: Dr. Maryland Pink   HPI:  Gabriella Ramos is an 41 y.o. 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 She was treated with cefazolin at [place] line was placed and she was only discharged to [facility]. She asked he was suffering from knee pain and swelling in her left knee during that admission to [facility] but then the did not seem to have been worked up she additionally had lower back and hip pain as well. While at [facility] she continue to have problems with worsening knee swelling and pain in on January 30 left knee was aspirated and these cultures grew methicillin sensitive staph aureus. She was finally transferred to [facility] on February 7. She has been seen by Dr. Delfino Lovett who took her to the operating room and performed I&D of her left knee via open arthrotomy. She remains on cefazolin blood cultures drawn on admission are for Gabriella Ramos not growing any organisms. An MRI of her pelvis and spine is also pending     Past Medical History:  Diagnosis Date  . Chronic pain syndrome   . Depression with anxiety   . IVDU (intravenous drug user)   . Tobacco abuse     Past Surgical History:  Procedure Laterality Date  . APPENDECTOMY    . CESAREAN SECTION    . IRRIGATION AND DEBRIDEMENT KNEE Left 12/11/2016   Procedure: IRRIGATION AND DEBRIDEMENT KNEE AND SYNOVECTOMY;  Surgeon: Rod Can, MD;  Location: [facility];  Service: Orthopedics;  Laterality: Left;  . KIDNEY SURGERY      Social History:  has no tobacco, alcohol, and drug history on file.   Family History    Problem Relation Age of Onset  . Alcoholism Mother     Allergies  Allergen Reactions  . Ketorolac Tromethamine Shortness Of Breath  . Sulfa Antibiotics      Medications: I have reviewed patients current medications as documented in Epic Anti-infectives    Start     Dose/Rate Route Frequency Ordered Stop   12/12/16 0600  ceFAZolin (ANCEF) IVPB 2g/100 mL premix  Status:  Discontinued     2 g 200 mL/hr over 30 Minutes Intravenous On call to O.R. 12/11/16 1558 12/11/16 1606   12/10/16 2330  ceFAZolin (ANCEF) IVPB 2g/100 mL premix     2 g 200 mL/hr over 30 Minutes Intravenous Every 8 hours 12/10/16 2245           ROS:as in HPI otherwise remainder of 12 point Review of Systems is negative  Blood pressure (!) 135/103, pulse (!) 109, temperature 98.6 F (37 C), temperature source Oral, resp. rate 18, height _0  (1.626 m), weight 197 lb 1.5 oz (89.4 kg), SpO2 97 %. General: Alert and awake, oriented x3, not in any acute distress. HEENT: anicteric sclera,  EOMI, oropharynx clear and without exudate Cardiovascular: tachy rate, normal r,  no murmur rubs or gallops Pulmonary: clear to auscultation bilaterally, no wheezing, rales or rhonchi Gastrointestinal: soft nontender, nondistended, normal bowel sounds, Musculoskeletal: Left knee with bandage  Skin, she has some erythema on her right foot which she says is chronic Neuro: nonfocal,  strength and sensation intact   Results for orders placed or performed during the hospital encounter of 12/10/16 (from the past 48 hour(s))  CBC with Differential/Platelet     Status: Abnormal   Collection Time: 12/11/16 12:23 AM  Result Value Ref Range   WBC 6.5 4.0 - 10.5 K/uL   RBC 2.50 (L) 3.87 - 5.11 MIL/uL   Hemoglobin 6.8 (LL) 12.0 - 15.0 g/dL    Comment: REPEATED TO VERIFY CRITICAL RESULT CALLED TO, READ BACK BY AND VERIFIED WITH: J WOODY,RN 0051 12/11/16 D BRADLEY    HCT 21.2 (L) 36.0 - 46.0 %   MCV 84.8 78.0 - 100.0 fL   MCH 27.2 26.0  - 34.0 pg   MCHC 32.1 30.0 - 36.0 g/dL   RDW 15.1 11.5 - 15.5 %   Platelets 311 150 - 400 K/uL   Neutrophils Relative % 71 %   Neutro Abs 4.6 1.7 - 7.7 K/uL   Lymphocytes Relative 21 %   Lymphs Abs 1.4 0.7 - 4.0 K/uL   Monocytes Relative 7 %   Monocytes Absolute 0.5 0.1 - 1.0 K/uL   Eosinophils Relative 1 %   Eosinophils Absolute 0.0 0.0 - 0.7 K/uL   Basophils Relative 1 %   Basophils Absolute 0.0 0.0 - 0.1 K/uL  Basic metabolic panel     Status: Abnormal   Collection Time: 12/11/16 12:23 AM  Result Value Ref Range   Sodium 136 135 - 145 mmol/L   Potassium 4.3 3.5 - 5.1 mmol/L   Chloride 106 101 - 111 mmol/L   CO2 21 (L) 22 - 32 mmol/L   Glucose, Bld 106 (H) 65 - 99 mg/dL   BUN 17 6 - 20 mg/dL   Creatinine, Ser 0.97 0.44 - 1.00 mg/dL   Calcium 8.0 (L) 8.9 - 10.3 mg/dL   GFR calc non Af Amer >60 >60 mL/min   GFR calc Af Amer >60 >60 mL/min    Comment: (NOTE) The eGFR has been calculated using the CKD EPI equation. This calculation has not been validated in all clinical situations. eGFR's persistently <60 mL/min signify possible Chronic Kidney Disease.    Anion gap 9 5 - 15  Protime-INR     Status: Abnormal   Collection Time: 12/11/16 12:23 AM  Result Value Ref Range   Prothrombin Time 16.0 (H) 11.4 - 15.2 seconds   INR 1.28   APTT     Status: Abnormal   Collection Time: 12/11/16 12:23 AM  Result Value Ref Range   aPTT 52 (H) 24 - 36 seconds    Comment:        IF BASELINE aPTT IS ELEVATED, SUGGEST PATIENT RISK ASSESSMENT BE USED TO DETERMINE APPROPRIATE ANTICOAGULANT THERAPY.   Culture, blood (Routine X 2) w Reflex to ID Panel     Status: None (Preliminary result)   Collection Time: 12/11/16 12:23 AM  Result Value Ref Range   Specimen Description BLOOD LEFT ANTECUBITAL    Special Requests IN PEDIATRIC BOTTLE 2CC    Culture NO GROWTH 1 DAY    Report Status PENDING   Culture, blood (Routine X 2) w Reflex to ID Panel     Status: None (Preliminary result)    Collection Time: 12/11/16 12:23 AM  Result Value Ref Range   Specimen Description BLOOD LEFT HAND    Special Requests IN PEDIATRIC BOTTLE 2CC    Culture NO GROWTH 1 DAY    Report Status PENDING   Lactic acid, plasma     Status:  None   Collection Time: 12/11/16 12:23 AM  Result Value Ref Range   Lactic Acid, Venous 0.7 0.5 - 1.9 mmol/L  Basic metabolic panel     Status: Abnormal   Collection Time: 12/11/16  2:14 AM  Result Value Ref Range   Sodium 136 135 - 145 mmol/L   Potassium 4.3 3.5 - 5.1 mmol/L   Chloride 105 101 - 111 mmol/L   CO2 23 22 - 32 mmol/L   Glucose, Bld 96 65 - 99 mg/dL   BUN 15 6 - 20 mg/dL   Creatinine, Ser 0.98 0.44 - 1.00 mg/dL   Calcium 7.9 (L) 8.9 - 10.3 mg/dL   GFR calc non Af Amer >60 >60 mL/min   GFR calc Af Amer >60 >60 mL/min    Comment: (NOTE) The eGFR has been calculated using the CKD EPI equation. This calculation has not been validated in all clinical situations. eGFR's persistently <60 mL/min signify possible Chronic Kidney Disease.    Anion gap 8 5 - 15  CBC     Status: Abnormal   Collection Time: 12/11/16  2:14 AM  Result Value Ref Range   WBC 5.8 4.0 - 10.5 K/uL   RBC 2.72 (L) 3.87 - 5.11 MIL/uL   Hemoglobin 7.3 (L) 12.0 - 15.0 g/dL   HCT 23.2 (L) 36.0 - 46.0 %   MCV 85.3 78.0 - 100.0 fL   MCH 26.8 26.0 - 34.0 pg   MCHC 31.5 30.0 - 36.0 g/dL   RDW 15.2 11.5 - 15.5 %   Platelets 301 150 - 400 K/uL  Lactate dehydrogenase     Status: Abnormal   Collection Time: 12/11/16  2:14 AM  Result Value Ref Range   LDH 88 (L) 98 - 192 U/L  Haptoglobin     Status: Abnormal   Collection Time: 12/11/16  2:14 AM  Result Value Ref Range   Haptoglobin 286 (H) 34 - 200 mg/dL    Comment: (NOTE) Performed At: [facility] [address] [place], [place] [id number] Lindon Romp MD PT:[id number]   Type and screen [place]     Status: None   Collection Time: 12/11/16  2:14 AM  Result Value Ref Range   ISSUE DATE / TIME  [id number]    Blood Product Unit Number [id number]    PRODUCT CODE [id number]    Unit Type and Rh 5100    Blood Product Expiration Date 903009233007   Prepare RBC     Status: None   Collection Time: 12/11/16  2:14 AM  Result Value Ref Range   Order Confirmation ORDER PROCESSED BY BLOOD BANK   Save smear     Status: None   Collection Time: 12/11/16  2:14 AM  Result Value Ref Range   Smear Review SMEAR STAINED AND AVAILABLE FOR REVIEW   ABO/Rh     Status: None   Collection Time: 12/11/16  2:14 AM  Result Value Ref Range   ABO/RH(D) O POS   Glucose, capillary     Status: None   Collection Time: 12/11/16 11:25 AM  Result Value Ref Range   Glucose-Capillary 85 65 - 99 mg/dL  Surgical PCR screen     Status: None   Collection Time: 12/11/16  5:13 PM  Result Value Ref Range   MRSA, PCR NEGATIVE NEGATIVE   Staphylococcus aureus NEGATIVE NEGATIVE    Comment:        The Xpert SA Assay (FDA approved for NASAL specimens in patients over 21  years of age), is one component of a comprehensive surveillance program.  Test performance has been validated by [facility] for patients greater than or equal to 63 year old. It is not intended to diagnose infection nor to guide or monitor treatment.   Synovial cell count + diff, w/ crystals     Status: Abnormal   Collection Time: 12/11/16  6:51 PM  Result Value Ref Range   Color, Synovial AMBER (A) YELLOW   Appearance-Synovial TURBID (A) CLEAR   Crystals, Fluid NO CRYSTALS SEEN    WBC, Synovial 11,650 (H) 0 - 200 /cu mm   Neutrophil, Synovial 83 (H) 0 - 25 %   Lymphocytes-Synovial Fld 10 0 - 20 %   Monocyte-Macrophage-Synovial Fluid 7 (L) 50 - 90 %   Eosinophils-Synovial 0 0 - 1 %  Aerobic/Anaerobic Culture (surgical/deep wound)     Status: None (Preliminary result)   Collection Time: 12/11/16  7:12 PM  Result Value Ref Range   Specimen Description TISSUE LEFT KNEE    Special Requests SYNOVIAL    Gram Stain      ABUNDANT WBC  PRESENT,BOTH PMN AND MONONUCLEAR NO ORGANISMS SEEN    Culture NO GROWTH < 24 HOURS    Report Status PENDING   CBC     Status: Abnormal   Collection Time: 12/12/16  4:24 AM  Result Value Ref Range   WBC 5.5 4.0 - 10.5 K/uL   RBC 2.94 (L) 3.87 - 5.11 MIL/uL   Hemoglobin 8.0 (L) 12.0 - 15.0 g/dL   HCT 25.4 (L) 36.0 - 46.0 %   MCV 86.4 78.0 - 100.0 fL   MCH 27.2 26.0 - 34.0 pg   MCHC 31.5 30.0 - 36.0 g/dL   RDW 15.0 11.5 - 15.5 %   Platelets 312 150 - 400 K/uL  Comprehensive metabolic panel     Status: Abnormal   Collection Time: 12/12/16  4:24 AM  Result Value Ref Range   Sodium 137 135 - 145 mmol/L   Potassium 4.5 3.5 - 5.1 mmol/L   Chloride 107 101 - 111 mmol/L   CO2 22 22 - 32 mmol/L   Glucose, Bld 91 65 - 99 mg/dL   BUN 11 6 - 20 mg/dL   Creatinine, Ser 0.87 0.44 - 1.00 mg/dL   Calcium 7.9 (L) 8.9 - 10.3 mg/dL   Total Protein 5.7 (L) 6.5 - 8.1 g/dL   Albumin 1.5 (L) 3.5 - 5.0 g/dL   AST 10 (L) 15 - 41 U/L   ALT <5 (L) 14 - 54 U/L   Alkaline Phosphatase 105 38 - 126 U/L   Total Bilirubin 0.2 (L) 0.3 - 1.2 mg/dL   GFR calc non Af Amer >60 >60 mL/min   GFR calc Af Amer >60 >60 mL/min    Comment: (NOTE) The eGFR has been calculated using the CKD EPI equation. This calculation has not been validated in all clinical situations. eGFR's persistently <60 mL/min signify possible Chronic Kidney Disease.    Anion gap 8 5 - 15  Glucose, capillary     Status: None   Collection Time: 12/12/16  8:04 AM  Result Value Ref Range   Glucose-Capillary 99 65 - 99 mg/dL   _0 (sdes,specrequest,cult,reptstatus)   ) Recent Results (from the past 720 hour(s))  Culture, blood (Routine X 2) w Reflex to ID Panel     Status: None (Preliminary result)   Collection Time: 12/11/16 12:23 AM  Result Value Ref Range Status   Specimen Description BLOOD LEFT  ANTECUBITAL  Final   Special Requests IN PEDIATRIC BOTTLE 2CC  Final   Culture NO GROWTH 1 DAY  Final   Report Status PENDING   Incomplete  Culture, blood (Routine X 2) w Reflex to ID Panel     Status: None (Preliminary result)   Collection Time: 12/11/16 12:23 AM  Result Value Ref Range Status   Specimen Description BLOOD LEFT HAND  Final   Special Requests IN PEDIATRIC BOTTLE 2CC  Final   Culture NO GROWTH 1 DAY  Final   Report Status PENDING  Incomplete  Surgical PCR screen     Status: None   Collection Time: 12/11/16  5:13 PM  Result Value Ref Range Status   MRSA, PCR NEGATIVE NEGATIVE Final   Staphylococcus aureus NEGATIVE NEGATIVE Final    Comment:        The Xpert SA Assay (FDA approved for NASAL specimens in patients over 29 years of age), is one component of a comprehensive surveillance program.  Test performance has been validated by [facility] for patients greater than or equal to 39 year old. It is not intended to diagnose infection nor to guide or monitor treatment.   Aerobic/Anaerobic Culture (surgical/deep wound)     Status: None (Preliminary result)   Collection Time: 12/11/16  7:12 PM  Result Value Ref Range Status   Specimen Description TISSUE LEFT KNEE  Final   Special Requests SYNOVIAL  Final   Gram Stain   Final    ABUNDANT WBC PRESENT,BOTH PMN AND MONONUCLEAR NO ORGANISMS SEEN    Culture NO GROWTH < 24 HOURS  Final   Report Status PENDING  Incomplete     Impression/Recommendation  Principal Problem:   Septic arthritis of knee, left (HCC) Active Problems:   Tobacco abuse   IVDU (intravenous drug user)   Depression with anxiety   Bacterial endocarditis   Normocytic anemia   Polysubstance abuse   Constipation   Chronic pain syndrome   GERD (gastroesophageal reflux disease)   Gabriella Ramos is a 41 y.o. 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 she does have hip and back pain that does need to be worked up with an MRI.  #1 Methicillin sensitive staphylococcus aureus bacteremia with tricuspid valve endocarditis with septic emboli to lungs and "metastatic infection with left septic knee and concern for infection of spine and/or hip.  --obtain records from [place]  Did they document clearance of her bacteremia PRIOR to placing PICC line???  In future patients such as this one should always be transferred to [facility] for formal evaluation by infectious disease. Note we see every single patient with a positive staph aureus blood culture at [place] [facility].  The patient is a perfect example of why we do this .  Again patients  admitted to [facility] or [facility] with SAB should be transferred here for expert ID consultation. At minimum phone conversation should've been made with [place] but for optimal care she really should've been transferred  Now we will have to start the clock over in terms of her parenteral antibiotics to postop day 1 which may be from her knee surgery but may be from another  surgery if she needs surgery on her hip or spine.  #2 IVDU: check HIV, HCV, Hep B    12/12/2016, 5:10 PM   Thank you so much for this interesting consult  [place] for [place] [phone] (pager) [phone] (office) 12/12/2016, 5:10 PM  Rhina Brackett [place] 12/12/2016, 5:10 PM

## 2016-12-13 ENCOUNTER — Inpatient Hospital Stay (HOSPITAL_COMMUNITY): Payer: Medicaid Other

## 2016-12-13 DIAGNOSIS — M461 Sacroiliitis, not elsewhere classified: Secondary | ICD-10-CM

## 2016-12-13 DIAGNOSIS — M25552 Pain in left hip: Secondary | ICD-10-CM

## 2016-12-13 LAB — URINALYSIS, ROUTINE W REFLEX MICROSCOPIC
BILIRUBIN URINE: NEGATIVE
Glucose, UA: NEGATIVE mg/dL
Ketones, ur: NEGATIVE mg/dL
LEUKOCYTES UA: NEGATIVE
NITRITE: NEGATIVE
PROTEIN: 100 mg/dL — AB
Specific Gravity, Urine: 1.014 (ref 1.005–1.030)
pH: 5 (ref 5.0–8.0)

## 2016-12-13 LAB — BASIC METABOLIC PANEL
Anion gap: 10 (ref 5–15)
BUN: 9 mg/dL (ref 6–20)
CHLORIDE: 105 mmol/L (ref 101–111)
CO2: 21 mmol/L — AB (ref 22–32)
CREATININE: 0.86 mg/dL (ref 0.44–1.00)
Calcium: 8 mg/dL — ABNORMAL LOW (ref 8.9–10.3)
GFR calc non Af Amer: >60 mL/min (ref >60)
Glucose, Bld: 95 mg/dL (ref 65–99)
Potassium: 4.2 mmol/L (ref 3.5–5.1)
Sodium: 136 mmol/L (ref 135–145)

## 2016-12-13 LAB — C-REACTIVE PROTEIN: CRP: 16.9 mg/dL — ABNORMAL HIGH (ref <1.0)

## 2016-12-13 LAB — CBC
HEMATOCRIT: 27.8 % — AB (ref 36.0–46.0)
HEMOGLOBIN: 8.7 g/dL — AB (ref 12.0–15.0)
MCH: 27.2 pg (ref 26.0–34.0)
MCHC: 31.3 g/dL (ref 30.0–36.0)
MCV: 86.9 fL (ref 78.0–100.0)
Platelets: 319 10*3/uL (ref 150–400)
RBC: 3.2 MIL/uL — ABNORMAL LOW (ref 3.87–5.11)
RDW: 15 % (ref 11.5–15.5)
WBC: 6.1 10*3/uL (ref 4.0–10.5)

## 2016-12-13 LAB — HIV ANTIBODY (ROUTINE TESTING W REFLEX): HIV Screen 4th Generation wRfx: NONREACTIVE

## 2016-12-13 LAB — GLUCOSE, CAPILLARY: GLUCOSE-CAPILLARY: 97 mg/dL (ref 65–99)

## 2016-12-13 LAB — SEDIMENTATION RATE: SED RATE: 120 mm/h — AB (ref 0–22)

## 2016-12-13 MED ORDER — HYDROMORPHONE HCL 1 MG/ML IJ SOLN: 1.0000 mg | INTRAMUSCULAR | Status: DC | PRN

## 2016-12-13 MED ORDER — HYDROMORPHONE HCL 1 MG/ML IJ SOLN
1.0000 mg | Freq: Once | INTRAMUSCULAR | Status: AC
  Administered 2016-12-13: 1 mg via INTRAVENOUS
  Filled 2016-12-13: qty 1

## 2016-12-13 MED ORDER — HEPARIN SOD (PORK) LOCK FLUSH 100 UNIT/ML IV SOLN
250.0000 [IU] | INTRAVENOUS | Status: AC | PRN
  Administered 2016-12-13: 250 [IU]

## 2016-12-13 NOTE — Progress Notes (Signed)
Subjective:  Patient reports pain as moderate to severe.  C/o L knee, L hip, LB pain.  Objective:   VITALS:   Vitals:   12/12/16 1844 12/12/16 2150 12/13/16 0452 12/13/16 0832  BP: (!) 130/98 (!) 158/102 (!) 151/102 (!) 147/103  Pulse: (!) 105 (!) 110 (!) 118 (!) 116  Resp: 20 18 19    Temp: 98.5 F (36.9 C) 98.8 F (37.1 C) 98.5 F (36.9 C) 99.3 F (37.4 C)  TempSrc: Oral Oral Oral Oral  SpO2: 98% 97% 95% 95%  Weight:  89.6 kg (197 lb 8.5 oz)    Height:        NAD ABD soft Sensation intact distally Intact pulses distally Dorsiflexion/Plantar flexion intact   Lab Results  Component Value Date   WBC 5.5 12/12/2016   HGB 8.0 (L) 12/12/2016   HCT 25.4 (L) 12/12/2016   MCV 86.4 12/12/2016   PLT 312 12/12/2016   BMET    Component Value Date/Time   NA 136 12/13/2016 0426   K 4.2 12/13/2016 0426   CL 105 12/13/2016 0426   CO2 21 (L) 12/13/2016 0426   GLUCOSE 95 12/13/2016 0426   BUN 9 12/13/2016 0426   CREATININE 0.86 12/13/2016 0426   CALCIUM 8.0 (L) 12/13/2016 0426   GFRNONAA >60 12/13/2016 0426   GFRAA >60 12/13/2016 0426     Recent Results (from the past 240 hour(s))  Culture, blood (Routine X 2) w Reflex to ID Panel     Status: None (Preliminary result)   Collection Time: 12/11/16 12:23 AM  Result Value Ref Range Status   Specimen Description BLOOD LEFT ANTECUBITAL  Final   Special Requests IN PEDIATRIC BOTTLE 2CC  Final   Culture NO GROWTH 1 DAY  Final   Report Status PENDING  Incomplete  Culture, blood (Routine X 2) w Reflex to ID Panel     Status: None (Preliminary result)   Collection Time: 12/11/16 12:23 AM  Result Value Ref Range Status   Specimen Description BLOOD LEFT HAND  Final   Special Requests IN PEDIATRIC BOTTLE 2CC  Final   Culture NO GROWTH 1 DAY  Final   Report Status PENDING  Incomplete  Surgical PCR screen     Status: None   Collection Time: 12/11/16  5:13 PM  Result Value Ref Range Status   MRSA, PCR NEGATIVE NEGATIVE Final     Staphylococcus aureus NEGATIVE NEGATIVE Final    Comment:        The Xpert SA Assay (FDA approved for NASAL specimens in patients over 39 years of age), is one component of a comprehensive surveillance program.  Test performance has been validated by [facility] for patients greater than or equal to 39 year old. It is not intended to diagnose infection nor to guide or monitor treatment.   Aerobic/Anaerobic Culture (surgical/deep wound)     Status: None (Preliminary result)   Collection Time: 12/11/16  7:12 PM  Result Value Ref Range Status   Specimen Description TISSUE LEFT KNEE  Final   Special Requests SYNOVIAL  Final   Gram Stain   Final    ABUNDANT WBC PRESENT,BOTH PMN AND MONONUCLEAR NO ORGANISMS SEEN    Culture NO GROWTH < 24 HOURS  Final   Report Status PENDING  Incomplete       Assessment/Plan: 2 Days Post-Op   Principal Problem:   Septic arthritis of knee, left (HCC) Active Problems:   Tobacco abuse   IVDU (intravenous drug user)   Depression  with anxiety   Bacterial endocarditis   Normocytic anemia   Polysubstance abuse   Constipation   Chronic pain syndrome   GERD (gastroesophageal reflux disease)   Staphylococcus aureus bacteremia   Staphylococcus aureus bacteremia with sepsis (HCC)   Left hip pain   Acute left-sided low back pain without sciatica    WBAT LLE with walker MRI L spine and L hip (-) IV abx per ID D/C HV drain  Follow intraop cultures (may be (-) due to IV abx)   Gabriella Ramos, Gabriella Ramos 12/13/2016, 9:49 AM   Samson Frederic, MD Cell [phone]

## 2016-12-13 NOTE — Care Management Note (Signed)
Case Management Note  Patient Details  Name: Gabriella Ramos MRN: [id number] Date of Birth: 09/01/1976  Subjective/Objective:    Admitted with septic arthritis of the L knee, hx of recent admit to [facility] on 11/25/16 due to right lower lobe pneumonia, hx of Polysubstance abuse, IVDU and Tobacco abuse. She was found to have acute endocarditis. She was transferred to [facility] for continuation of IV Abx of Cefazoline for toal of 8 weeks with a stop date of 01/13/17.   David Bark (Other)     [phone]       Action/Plan: Per surgery .....open arthrotomy and drainage left knee today. CM to f/u with disposition needs.   Expected Discharge Date:                  Expected Discharge Plan:  [facility]AC  In-House Referral:  Clinical Social Work  Discharge planning Services  CM Consult  Post Acute Care Choice:    Choice offered to:     DME Arranged:    DME Agency:     HH Arranged:    HH Agency:     Status of Service:  In process, will continue to follow  If discussed at Long Length of Stay Meetings, dates discussed:    Additional Comments:  Shanera Meske Hudson, RN 12/11/2016, 2:12 PM  

## 2016-12-13 NOTE — Progress Notes (Signed)
Attempted to call report 3x. Report called to RN Koleen NimrodAdrian at [facility] approximately 6pm. [facility] called. Patient still on the floor at the end of shift, gave report to night shift RN.

## 2016-12-13 NOTE — Care Management Note (Signed)
Case Management Note  Patient Details  Name: Gabriella MendsRachel Lynn Mcmiller MRN: [id number] Date of Birth: [birth year]  Subjective/Objective:                 Admitted with septic arthritis of L knee.   Action/Plan: Plan is d/c back to [facility]/ [facility] today. Awaiting pt's room # and receiving MD's information from [facility]/ [place] @ [phone]. Once information is received nurse will need to call and give report to receiving nurse @ [facility]. Pt's d/c packet was place on front of patient chart per CM. [facility] [phone]( [phone]) will need to be called for pt's transportation back to [facility].  Expected Discharge Date:  12/13/16               Expected Discharge Plan:  Long Term Acute Care (LTAC) ([facility])  In-House Referral:  Clinical Social Work  Discharge planning Services  CM Consult     Status of Service:  Completed, signed off  If discussed at [employer] Meetings, dates discussed:    Additional Comments:  [name], Julious Langlois Hudson, RN 12/13/2016, 2:53 PM

## 2016-12-13 NOTE — Progress Notes (Signed)
Pt is being discharged to [facility] at this time via [facility]. Report was given. Pt was given pain medication prior to discharge. Vitals are stable. Pt is leaving with midline in right upper arm. Paperwork was already completed by day nurse. Belongings sent with pt.   Larey Dayshristy M Mechelle Pates, RN

## 2016-12-13 NOTE — Progress Notes (Signed)
Physical Therapy Treatment Patient Details Name: Gabriella Ramos MRN: [id number] DOB: 08-20-1976 Today's Date: 12/13/2016    History of Present Illness 41 year old Caucasian female with a past medical history of polysubstance abuse, tobacco abuse, IV drug use, chronic pain syndrome, drug-seeking behavior who was hospitalized in [facility] for pneumonia and found to have bacteremia and endocarditis. She was placed on IV antibiotics. She was transferred to [facility]. She complained of left knee pain and swelling. Joint was aspirated, which revealed MSSA.. Transferred to [facility]. Underwent I & D L knee 2/7.     PT Comments    Pt stating she doesn't feel like she can get OOB today, but agreeable to attempt. Pt unable to tolerate LLE resting below 90 degrees of hip flexion. She is unable to tolerate any ROM L knee. After sitting EOB approx 2 minutes, pt insistent on return to supine due to pain.   Follow Up Recommendations  SNF     Equipment Recommendations  Other (comment) (TBA)    Recommendations for Other Services       Precautions / Restrictions Precautions Precautions: Fall Restrictions Weight Bearing Restrictions: No    Mobility  Bed Mobility         Supine to sit: Min assist;HOB elevated Sit to supine: Min assist   General bed mobility comments: +rail, increased time to complete; assist needed with mobility/support LLE  Transfers                 General transfer comment: unable to progress beyond EOB due to pain  Ambulation/Gait             General Gait Details: unable   Stairs            Wheelchair Mobility    Modified Rankin (Stroke Patients Only)       Balance                                    Cognition Arousal/Alertness: Awake/alert Behavior During Therapy: Anxious;Impulsive Overall Cognitive Status: No family/caregiver present to determine baseline cognitive functioning                       Exercises      General Comments        Pertinent Vitals/Pain Pain Assessment: 0-10 Pain Score: 10-Worst pain ever Pain Location: L knee Pain Descriptors / Indicators: Moaning;Sharp;Stabbing;Grimacing;Guarding Pain Intervention(s): Limited activity within patient's tolerance;Monitored during session    Home Living                      Prior Function            PT Goals (current goals can now be found in the care plan section) Acute Rehab PT Goals Patient Stated Goal: to not be in pain PT Goal Formulation: With patient Time For Goal Achievement: 12/26/16 Potential to Achieve Goals: Good Progress towards PT goals: Progressing toward goals (slowly due to pain)    Frequency    Min 3X/week      PT Plan Current plan remains appropriate    Co-evaluation             End of Session   Activity Tolerance: Patient limited by pain Patient left: in bed;with call bell/phone within reach     Time: 4098-1191 PT Time Calculation (min) (ACUTE ONLY): 12 min  Charges:  $Therapeutic Activity: 8-22 mins  G Codes:      [name], [name] 12/13/2016, 10:06 AM

## 2016-12-13 NOTE — Discharge Summary (Addendum)
[facility]  Physician Discharge Summary   Patient ID: Gabriella Ramos MRN: [id number] DOB/AGE: 01-06-1976 41 y.o.  Admit date: 12/10/2016 Discharge date: 12/13/2016  PCP: No primary care provider on file.  DISCHARGE DIAGNOSES:  Principal Problem:   Septic arthritis of knee, left (HCC) Active Problems:   Tobacco abuse   IVDU (intravenous drug user)   Depression with anxiety   Bacterial endocarditis   Normocytic anemia   Polysubstance abuse   Constipation   Chronic pain syndrome   GERD (gastroesophageal reflux disease)   Staphylococcus aureus bacteremia   Staphylococcus aureus bacteremia with sepsis (HCC)   Left hip pain   Acute left-sided low back pain without sciatica   RECOMMENDATIONS FOR OUTPATIENT FOLLOW UP: 1. Patient will need dressing changes to her left knee on Sunday. She will also need suture removal eventually. Please contact the orthopedic surgeon if there are any questions. 2. Cultures from the synovial fluid pending. 3. Patient will need intravenous Ancef for 6 weeks from 12/11/16. 4. Monitor labs including CBC and basic metabolic panel periodically.   DISCHARGE CONDITION: fair  Diet recommendation: As before  Filed Weights   12/11/16 1752 12/11/16 2040 12/12/16 2150  Weight: 90.3 kg (199 lb) 89.4 kg (197 lb 1.5 oz) 89.6 kg (197 lb 8.5 oz)    INITIAL HISTORY: 41 year old Caucasian female with a past medical history of polysubstance abuse, tobacco abuse, IV drug use, chronic pain syndrome, drug-seeking behavior who was hospitalized in [facility] for pneumonia and found to have bacteremia and endocarditis. She was placed on IV antibiotics. She was transferred to [facility] She complained of left knee pain and swelling. Joint was aspirated, which revealed MSSA. Patient was transferred here to be seen by orthopedics.  Consultants: Orthopedics (Dr. Linna Caprice) . Infectious disease.  Procedures:  2/7: 1. Open arthrotomy and drainage  of left knee. 2. Synovectomy of left knee.   HOSPITAL COURSE:   Septic arthritis of knee, left Patient was sent over from [facility] for further management. Patient was not septic. Lactate is 0.7. Hemodynamically she was stable. Patient was continued on IV cefazolin. Patient was seen by orthopedic surgeon. Patient underwent arthrotomy and drainage of the left knee. Elevated WBC noted in the fluid. Cultures are pending. Patient also has pain in her left hip as well as back. MRI of the left hip and lumbar spine was also done. No infectious foci found in either of the site. Sacroiliitis was noted. Infectious disease was also consulted. Patient will need IV Ancef for 6 weeks from the day of surgery, which was 2/7. Pain medications adjusted by orthopedic surgeon. She has been started on long-acting morphine as well as on Dilaudid tablets.  Normocytic anemia Pt was transfused with one units of blood on 12/09/16. Her Hgb was 7.9 on 12/10/16. Her hgb dropped again to 6.8 on 2/6. Etiology is not clear. She was transfused another unit. Hemoglobin has been stable. No overt bleeding has been noted. Bilirubin is normal. LDH is normal.  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 Patient is on IV cefazolin. She will have a new en date which will be 6 weeks from 12/11/16. Patient did complain of some pleuritic chest pain today. EKG shows sinus tachycardia, which is most likely due to her pain. Did not show any ischemic changes. Chest x-ray showed previously seen opacities consistent with septic embolization. Patient is saturating normal on room air. Patient had 2 positive blood cultures at [facility], one on 1/7 second one on 1/25. Discussed with providers at [facility]. They had negative cultures and only after that, the PICC line was placed. Cultures here have been negative.  Polysubstance abuse, IVDU and Tobacco abuse Counseled  about importance of quitting substance  Depression and anxiety Continue previous medications  Constipation Miralax and docusate  Chronic pain syndrome Recently started on methadone at [facility]. Patient is not experiencing much relief from this medication. Stopped by orthopedics. Started on long-acting morphine.  Overall stable. Further acute care can be provided/resumed at the facility that she came from. Her provider at [facility](Laury Lester) did call me and requested that we transfer her back. Medically she is stable to go back to her long-term acute care facility.   PERTINENT LABS:  The results of significant diagnostics from this hospitalization (including imaging, microbiology, ancillary and laboratory) are listed below for reference.    Microbiology: Recent Results (from the past 240 hour(s))  Culture, blood (Routine X 2) w Reflex to ID Panel     Status: None (Preliminary result)   Collection Time: 12/11/16 12:23 AM  Result Value Ref Range Status   Specimen Description BLOOD LEFT ANTECUBITAL  Final   Special Requests IN PEDIATRIC BOTTLE 2CC  Final   Culture NO GROWTH 1 DAY  Final   Report Status PENDING  Incomplete  Culture, blood (Routine X 2) w Reflex to ID Panel     Status: None (Preliminary result)   Collection Time: 12/11/16 12:23 AM  Result Value Ref Range Status   Specimen Description BLOOD LEFT HAND  Final   Special Requests IN PEDIATRIC BOTTLE 2CC  Final   Culture NO GROWTH 1 DAY  Final   Report Status PENDING  Incomplete  Surgical PCR screen     Status: None   Collection Time: 12/11/16  5:13 PM  Result Value Ref Range Status   MRSA, PCR NEGATIVE NEGATIVE Final   Staphylococcus aureus NEGATIVE NEGATIVE Final    Comment:        The Xpert SA Assay (FDA approved for NASAL specimens in patients over 39 years of age), is one component of a comprehensive surveillance program.  Test performance has been validated by [facility] for patients greater than or  equal to 39 year old. It is not intended to diagnose infection nor to guide or monitor treatment.   Aerobic/Anaerobic Culture (surgical/deep wound)     Status: None (Preliminary result)   Collection Time: 12/11/16  7:12 PM  Result Value Ref Range Status   Specimen Description TISSUE LEFT KNEE  Final   Special Requests SYNOVIAL  Final   Gram Stain   Final    ABUNDANT WBC PRESENT,BOTH PMN AND MONONUCLEAR NO ORGANISMS SEEN    Culture   Final    NO GROWTH 2 DAYS NO ANAEROBES ISOLATED; CULTURE IN PROGRESS FOR 5 DAYS   Report Status PENDING  Incomplete     Labs: Basic Metabolic Panel:  Recent Labs Lab 12/11/16 0023 12/11/16 0214 12/12/16 0424 12/13/16 0426  NA 136 136 137 136  K 4.3 4.3 4.5 4.2  CL 106 105 107 105  CO2 21* 23 22 21*  GLUCOSE 106* 96 91 95  BUN 17 15 11 9   CREATININE 0.97 0.98 0.87 0.86  CALCIUM 8.0* 7.9* 7.9* 8.0*   Liver Function Tests:  Recent Labs Lab 12/12/16 0424  AST 10*  ALT <5*  ALKPHOS 105  BILITOT 0.2*  PROT 5.7*  ALBUMIN 1.5*   CBC:  Recent Labs  Lab 12/11/16 0023 12/11/16 0214 12/12/16 0424 12/13/16 0426  WBC 6.5 5.8 5.5 6.1  NEUTROABS 4.6  --   --   --   HGB 6.8* 7.3* 8.0* 8.7*  HCT 21.2* 23.2* 25.4* 27.8*  MCV 84.8 85.3 86.4 86.9  PLT 311 301 312 319    CBG:  Recent Labs Lab 12/11/16 1125 12/12/16 0804 12/13/16 0748  GLUCAP 85 99 97     IMAGING STUDIES Dg Knee 1-2 Views Left  Result Date: 12/11/2016 CLINICAL DATA:  Status post fall in the snow 2 weeks ago. The patient ports pain radiating from the knee proximally to the hip. EXAM: LEFT KNEE - 1-2 VIEW COMPARISON:  None in PACs FINDINGS: The bones are subjectively adequately mineralized. The joint spaces are well maintained. There is no acute or healing fracture. There may be a small suprapatellar effusion. IMPRESSION: There is no acute bony abnormality of the left knee. There may be a small suprapatellar effusion. Electronically Signed   By: David  [place] M.D.   On:  12/11/2016 12:49   Mr Lumbar Spine Wo Contrast  Result Date: 12/12/2016 CLINICAL DATA:  History of IV drug use and MSSA bacteremia. Buttock and thigh pain. EXAM: MRI LUMBAR SPINE WITHOUT CONTRAST TECHNIQUE: Multiplanar, multisequence MR imaging of the lumbar spine was performed. No intravenous contrast was administered. COMPARISON:  None. FINDINGS: The examination is degraded by poor signal to noise ratio. Segmentation:  Normal Alignment:  Normal Vertebrae: No acute compression fracture, discitis-osteomyelitis, facet edema or other focal marrow lesion. No epidural collection. Conus medullaris: Extends to the L1 level and appears normal. Paraspinal and other soft tissues: Limited visualization but no focal abnormality is identified. Disc levels: T12-L1: Normal disc space and facets. No spinal canal or neuroforaminal stenosis. L1-L2: Normal disc space and facets. No spinal canal or neuroforaminal stenosis. L2-L3: Normal disc space and facets. No spinal canal or neuroforaminal stenosis. L3-L4: Normal disc space and facets. No spinal canal or neuroforaminal stenosis. L4-L5: Small disc bulge without spinal canal or neural foraminal stenosis. L5-S1: Normal disc space and facets. No spinal canal or neuroforaminal stenosis. IMPRESSION: Small L4-L5 disc bulge without spinal canal or neural foraminal stenosis. No focal marrow or disc signal abnormality. No epidural collection. Electronically Signed   By: Deatra Robinson M.D.   On: 12/12/2016 21:05   Mr Hip Left Wo Contrast  Result Date: 12/12/2016 CLINICAL DATA:  IVDA.  Rule out septic arthritis left hip. EXAM: MR OF THE LEFT HIP WITHOUT CONTRAST TECHNIQUE: Multiplanar, multisequence MR imaging was performed. No intravenous contrast was administered. COMPARISON:  None. FINDINGS: Bones: Bone marrow edema noted along both sides of the synovial portion of the left SI joint consistent with sacroiliitis. Articular cartilage and labrum Articular cartilage:  No focal chondral  defect. Labrum:  Intact. Joint or bursal effusion Joint effusion:  No significant joint effusion. Bursae:  No abnormal nor significant bursal fluid. Muscles and tendons Muscles and tendons:  No intramuscular hemorrhage or mass. Other findings Miscellaneous: Ascites within the pelvis. Diffuse anasarca noted of the pelvis. IMPRESSION: 1. Diffuse anasarca with ascites. 2. Bone marrow edema about left SI joint consistent sacroiliitis. 3. No labral tear of the left hip.  No focal chondral defect. 4. No hip joint effusion, marrow edema nor bone destruction to suggest septic arthritis. Electronically Signed   By: Tollie Eth M.D.   On: 12/12/2016 22:57   Dg Chest Port 1 View  Result Date: 12/13/2016 CLINICAL DATA:  Chronic pain.  Shortness of breath EXAM: PORTABLE  CHEST 1 VIEW COMPARISON:  11/29/2016 FINDINGS: Cardiomegaly. Small left pleural effusion. Patchy opacities throughout the left lung. Cannot exclude pneumonia. Right basilar atelectasis. No acute bony abnormality. IMPRESSION: Cardiomegaly. Patchy opacities throughout the left lung concerning for pneumonia. Small left pleural effusion. Right base atelectasis. Electronically Signed   By: Charlett Nose M.D.   On: 12/13/2016 13:19    DISCHARGE EXAMINATION: Vitals:   12/12/16 1844 12/12/16 2150 12/13/16 0452 12/13/16 0832  BP: (!) 130/98 (!) 158/102 (!) 151/102 (!) 147/103  Pulse: (!) 105 (!) 110 (!) 118 (!) 116  Resp: 20 18 19    Temp: 98.5 F (36.9 C) 98.8 F (37.1 C) 98.5 F (36.9 C) 99.3 F (37.4 C)  TempSrc: Oral Oral Oral Oral  SpO2: 98% 97% 95% 95%  Weight:  89.6 kg (197 lb 8.5 oz)    Height:       General appearance: alert, cooperative, appears stated age and no distress Resp: Coarse breath sounds bilaterally. No wheezing, rales or rhonchi. Cardio: S1S2 is slightly tachycardic. No S3, S4. No rubs or bruits. No murmurs. GI: soft, non-tender; bowel sounds normal; no masses,  no organomegaly Extremities: Left lower extremity covered in  dressing. Some edema in the right lower extremity.  DISPOSITION: Back to [facility]    ALLERGIES:  Allergies  Allergen Reactions  . Ketorolac Tromethamine Shortness Of Breath  . Sulfa Antibiotics     Current Inpatient Medications:  Scheduled: . amitriptyline  25 mg Oral QHS  .  ceFAZolin (ANCEF) IV  2 g Intravenous Q8H  . docusate sodium  100 mg Oral Daily  . DULoxetine  30 mg Oral Daily  . enoxaparin (LOVENOX) injection  40 mg Subcutaneous Q24H  . famotidine  20 mg Oral BID  . gabapentin  600 mg Oral TID  .  HYDROmorphone (DILAUDID) injection  1 mg Intravenous Once  . morphine  60 mg Oral Q12H  . nicotine  21 mg Transdermal Daily  . sodium chloride flush  3 mL Intravenous Q12H   Continuous: . sodium chloride 75 mL/hr at 12/11/16 2259   UJW:JXBJYNWGNFAOZ, clonazePAM, dextromethorphan-guaiFENesin, HYDROmorphone, methocarbamol **OR** methocarbamol (ROBAXIN)  IV, metoCLOPramide **OR** metoCLOPramide (REGLAN) injection, ondansetron, polyethylene glycol, sodium chloride flush, zolpidem   Follow-up Information    Swinteck, Cloyde Reams, MD. Schedule an appointment as soon as possible for a visit in 2 weeks.   Specialty:  Orthopedic Surgery Why:  For wound re-check, For suture removal Contact information: [address] [place] [address] [phone]           TOTAL DISCHARGE TIME: 35 mins  [facility]  [facility] Pager [phone]  12/13/2016, 2:01 PM

## 2016-12-13 NOTE — Progress Notes (Signed)
Subjective: No new complaints   Antibiotics:  Anti-infectives    Start     Dose/Rate Route Frequency Ordered Stop   12/12/16 0600  ceFAZolin (ANCEF) IVPB 2g/100 mL premix  Status:  Discontinued     2 g 200 mL/hr over 30 Minutes Intravenous On call to O.R. 12/11/16 1558 12/11/16 1606   12/10/16 2330  ceFAZolin (ANCEF) IVPB 2g/100 mL premix     2 g 200 mL/hr over 30 Minutes Intravenous Every 8 hours 12/10/16 2245        Medications: Scheduled Meds: . amitriptyline  25 mg Oral QHS  .  ceFAZolin (ANCEF) IV  2 g Intravenous Q8H  . docusate sodium  100 mg Oral Daily  . DULoxetine  30 mg Oral Daily  . enoxaparin (LOVENOX) injection  40 mg Subcutaneous Q24H  . famotidine  20 mg Oral BID  . gabapentin  600 mg Oral TID  . morphine  60 mg Oral Q12H  . nicotine  21 mg Transdermal Daily  . sodium chloride flush  3 mL Intravenous Q12H   Continuous Infusions: . sodium chloride 75 mL/hr at 12/11/16 2259   PRN Meds:.acetaminophen, clonazePAM, dextromethorphan-guaiFENesin, HYDROmorphone (DILAUDID) injection, HYDROmorphone, methocarbamol **OR** methocarbamol (ROBAXIN)  IV, metoCLOPramide **OR** metoCLOPramide (REGLAN) injection, ondansetron, polyethylene glycol, sodium chloride flush, zolpidem    Objective: Weight change: -1 lb 7.5 oz (-0.666 kg)  Intake/Output Summary (Last 24 hours) at 12/13/16 1154 Last data filed at 12/13/16 0900  Gross per 24 hour  Intake              800 ml  Output              725 ml  Net               75 ml   Blood pressure (!) 147/103, pulse (!) 116, temperature 99.3 F (37.4 C), temperature source Oral, resp. rate 19, height 5\' 4"  (1.626 m), weight 197 lb 8.5 oz (89.6 kg), SpO2 95 %. Temp:  [98.5 F (36.9 C)-99.3 F (37.4 C)] 99.3 F (37.4 C) (02/09 0832) Pulse Rate:  [105-118] 116 (02/09 0832) Resp:  [18-20] 19 (02/09 0452) BP: (130-158)/(98-103) 147/103 (02/09 0832) SpO2:  [95 %-98 %] 95 % (02/09 0832) Weight:  [197 lb 8.5 oz (89.6 kg)]  197 lb 8.5 oz (89.6 kg) (02/08 2150)  Physical Exam: General: Alert and awake, oriented x3, not in any acute distress. HEENT: anicteric sclera,  EOMI, oropharynx clear and without exudate Cardiovascular: tachy rate, normal r,  no murmur rubs or gallops Pulmonary: clear to auscultation bilaterally, no wheezing, rales or rhonchi Gastrointestinal: soft nontender, nondistended, normal bowel sounds, Musculoskeletal: Left knee with bandage  Skin, she has some erythema on her right foot which she says is chronic Neuro: nonfocal, strength and sensation intact  CBC:  CBC Latest Ref Rng & Units 12/13/2016 12/12/2016 12/11/2016  WBC 4.0 - 10.5 K/uL 6.1 5.5 5.8  Hemoglobin 12.0 - 15.0 g/dL 1.1(B) 1.4(N) 7.3(L)  Hematocrit 36.0 - 46.0 % 27.8(L) 25.4(L) 23.2(L)  Platelets 150 - 400 K/uL 319 312 301      BMET  Recent Labs  12/12/16 0424 12/13/16 0426  NA 137 136  K 4.5 4.2  CL 107 105  CO2 22 21*  GLUCOSE 91 95  BUN 11 9  CREATININE 0.87 0.86  CALCIUM 7.9* 8.0*     Liver Panel   Recent Labs  12/12/16 0424  PROT 5.7*  ALBUMIN 1.5*  AST 10*  ALT <5*  ALKPHOS 105  BILITOT 0.2*       Sedimentation Rate  Recent Labs  12/13/16 0426  ESRSEDRATE 120*   C-Reactive Protein  Recent Labs  12/13/16 0426  CRP 16.9*    Micro Results: Recent Results (from the past 720 hour(s))  Culture, blood (Routine X 2) w Reflex to ID Panel     Status: None (Preliminary result)   Collection Time: 12/11/16 12:23 AM  Result Value Ref Range Status   Specimen Description BLOOD LEFT ANTECUBITAL  Final   Special Requests IN PEDIATRIC BOTTLE 2CC  Final   Culture NO GROWTH 1 DAY  Final   Report Status PENDING  Incomplete  Culture, blood (Routine X 2) w Reflex to ID Panel     Status: None (Preliminary result)   Collection Time: 12/11/16 12:23 AM  Result Value Ref Range Status   Specimen Description BLOOD LEFT HAND  Final   Special Requests IN PEDIATRIC BOTTLE 2CC  Final   Culture NO GROWTH  1 DAY  Final   Report Status PENDING  Incomplete  Surgical PCR screen     Status: None   Collection Time: 12/11/16  5:13 PM  Result Value Ref Range Status   MRSA, PCR NEGATIVE NEGATIVE Final   Staphylococcus aureus NEGATIVE NEGATIVE Final    Comment:        The Xpert SA Assay (FDA approved for NASAL specimens in patients over 39 years of age), is one component of a comprehensive surveillance program.  Test performance has been validated by [facility] for patients greater than or equal to 39 year old. It is not intended to diagnose infection nor to guide or monitor treatment.   Aerobic/Anaerobic Culture (surgical/deep wound)     Status: None (Preliminary result)   Collection Time: 12/11/16  7:12 PM  Result Value Ref Range Status   Specimen Description TISSUE LEFT KNEE  Final   Special Requests SYNOVIAL  Final   Gram Stain   Final    ABUNDANT WBC PRESENT,BOTH PMN AND MONONUCLEAR NO ORGANISMS SEEN    Culture   Final    NO GROWTH 2 DAYS NO ANAEROBES ISOLATED; CULTURE IN PROGRESS FOR 5 DAYS   Report Status PENDING  Incomplete    Studies/Results: Dg Knee 1-2 Views Left  Result Date: 12/11/2016 CLINICAL DATA:  Status post fall in the snow 2 weeks ago. The patient ports pain radiating from the knee proximally to the hip. EXAM: LEFT KNEE - 1-2 VIEW COMPARISON:  None in PACs FINDINGS: The bones are subjectively adequately mineralized. The joint spaces are well maintained. There is no acute or healing fracture. There may be a small suprapatellar effusion. IMPRESSION: There is no acute bony abnormality of the left knee. There may be a small suprapatellar effusion. Electronically Signed   By: David  SwazilandJordan M.D.   On: 12/11/2016 12:49   Mr Lumbar Spine Wo Contrast  Result Date: 12/12/2016 CLINICAL DATA:  History of IV drug use and MSSA bacteremia. Buttock and thigh pain. EXAM: MRI LUMBAR SPINE WITHOUT CONTRAST TECHNIQUE: Multiplanar, multisequence MR imaging of the lumbar spine was  performed. No intravenous contrast was administered. COMPARISON:  None. FINDINGS: The examination is degraded by poor signal to noise ratio. Segmentation:  Normal Alignment:  Normal Vertebrae: No acute compression fracture, discitis-osteomyelitis, facet edema or other focal marrow lesion. No epidural collection. Conus medullaris: Extends to the L1 level and appears normal. Paraspinal and other soft tissues: Limited visualization but no focal abnormality is identified. Disc levels:  T12-L1: Normal disc space and facets. No spinal canal or neuroforaminal stenosis. L1-L2: Normal disc space and facets. No spinal canal or neuroforaminal stenosis. L2-L3: Normal disc space and facets. No spinal canal or neuroforaminal stenosis. L3-L4: Normal disc space and facets. No spinal canal or neuroforaminal stenosis. L4-L5: Small disc bulge without spinal canal or neural foraminal stenosis. L5-S1: Normal disc space and facets. No spinal canal or neuroforaminal stenosis. IMPRESSION: Small L4-L5 disc bulge without spinal canal or neural foraminal stenosis. No focal marrow or disc signal abnormality. No epidural collection. Electronically Signed   By: Deatra Robinson M.D.   On: 12/12/2016 21:05   Mr Hip Left Wo Contrast  Result Date: 12/12/2016 CLINICAL DATA:  IVDA.  Rule out septic arthritis left hip. EXAM: MR OF THE LEFT HIP WITHOUT CONTRAST TECHNIQUE: Multiplanar, multisequence MR imaging was performed. No intravenous contrast was administered. COMPARISON:  None. FINDINGS: Bones: Bone marrow edema noted along both sides of the synovial portion of the left SI joint consistent with sacroiliitis. Articular cartilage and labrum Articular cartilage:  No focal chondral defect. Labrum:  Intact. Joint or bursal effusion Joint effusion:  No significant joint effusion. Bursae:  No abnormal nor significant bursal fluid. Muscles and tendons Muscles and tendons:  No intramuscular hemorrhage or mass. Other findings Miscellaneous: Ascites within  the pelvis. Diffuse anasarca noted of the pelvis. IMPRESSION: 1. Diffuse anasarca with ascites. 2. Bone marrow edema about left SI joint consistent sacroiliitis. 3. No labral tear of the left hip.  No focal chondral defect. 4. No hip joint effusion, marrow edema nor bone destruction to suggest septic arthritis. Electronically Signed   By: Tollie Eth M.D.   On: 12/12/2016 22:57      Assessment/Plan:  INTERVAL HISTORY: MRI of lumbar spine shows area of sacroiliitis otherwise unremarkable MRI lumbar spine as pelvic MRI with contrast   Principal Problem:   Septic arthritis of knee, left (HCC) Active Problems:   Tobacco abuse   IVDU (intravenous drug user)   Depression with anxiety   Bacterial endocarditis   Normocytic anemia   Polysubstance abuse   Constipation   Chronic pain syndrome   GERD (gastroesophageal reflux disease)   Staphylococcus aureus bacteremia   Staphylococcus aureus bacteremia with sepsis (HCC)   Left hip pain   Acute left-sided low back pain without sciatica    Gabriella Ramos is a 41 y.o. female with History of IV drug use found to have methicillin sensitive staph aureus bacteremia with tricuspid valve endocarditis and septic emboli to [facility] where they failed to also work her up for "metasatic sites of infection" such as her septic knee that was eventually discovered and she is now sp I and D. she does have hip and back pain and has sacroiliitis on MRI   #1 Methicillin sensitive staphylococcus aureus bacteremia with tricuspid valve endocarditis with septic emboli to lungs and "metastatic infection with left septic knee and sacroiliitis  WE NEED TO FIND OUT IF SHE EVER CLEARED HER BACTEREMIA AT [place] PRIOR TO PLACEMENT OF PICC  IF NOT THEN THIS PICC NEEDS TO BE DC'D, REPEAT BLOOD CULTURES OBTAINED AND NEW PICC PLACED  Once this is sorted out we can put her on course to complete 6 weeks of IV ancef in SNF  Dr. Orvan Falconer available on the  weekend for questions.     LOS: 3 days   Gabriella Ramos 12/13/2016, 11:54 AM

## 2016-12-13 NOTE — Progress Notes (Signed)
Kaley from [facility] called and informed CM pt's room # is 314, receiving MD is Dr.Jason Leonia ReaderVan Eyk, and the number to call and give report the to receiving nurse ( [facility]) [phone]. CM made nurse Dolores Lory([facility]) aware. Gae GallopAngela Geary Rufo RN,BSN,CM

## 2016-12-13 NOTE — Progress Notes (Signed)
Pharmacy Antibiotic Note  Gabriella MendsRachel Lynn Ramos is a 41 y.o. female admitted on 12/10/2016 with septic joint (MSSA).  Pharmacy has been consulted for cefazolin dosing.  SCr remains stable at 0.86, CrCl ~90-11000mL/min. S/p open arthrotomy, synovectomy and drainage of left knee 2/7 MRI of lumbar spine shows area of sacrolilitis, otherwise unremarkable.  Plan: Ancef 2g IV q8h Follow c/s, clinical progression, renal function, ID workup  Height: 5\' 4"  (162.6 cm) Weight: 197 lb 8.5 oz (89.6 kg) IBW/kg (Calculated) : 54.7  Temp (24hrs), Avg:98.8 F (37.1 C), Min:98.5 F (36.9 C), Max:99.3 F (37.4 C)   Recent Labs Lab 12/11/16 0023 12/11/16 0214 12/12/16 0424 12/13/16 0426  WBC 6.5 5.8 5.5 6.1  CREATININE 0.97 0.98 0.87 0.86  LATICACIDVEN 0.7  --   --   --     Estimated Creatinine Clearance: 96.2 mL/min (by C-G formula based on SCr of 0.86 mg/dL).    Allergies  Allergen Reactions  . Ketorolac Tromethamine Shortness Of Breath  . Sulfa Antibiotics    Antimicrobials this admission:  Ancef (PTA as well)2/6 >>   Dose adjustments this admission:  N/a  Microbiology results:  2/7 BCx: ngtd 2/7 tissue L knee: ngtd 2/7 L knee synovial fluid: ngtd 2/7 MRSA PCR: neg  Abrielle Finck D. Signe Tackitt, PharmD, BCPS Clinical Pharmacist Pager: [phone] 12/13/2016 12:57 PM

## 2016-12-14 ENCOUNTER — Inpatient Hospital Stay (HOSPITAL_COMMUNITY)
Admission: AD | Admit: 2016-12-14 | Discharge: 2016-12-18 | DRG: 871 | Disposition: A | Discharge status: Skilled Nursing Facility | Payer: Medicaid Other | Source: Other Acute Inpatient Hospital | Attending: Internal Medicine | Admitting: Internal Medicine

## 2016-12-14 ENCOUNTER — Inpatient Hospital Stay (HOSPITAL_COMMUNITY): Payer: Medicaid Other

## 2016-12-14 DIAGNOSIS — R34 Anuria and oliguria: Secondary | ICD-10-CM | POA: Diagnosis present

## 2016-12-14 DIAGNOSIS — I079 Rheumatic tricuspid valve disease, unspecified: Secondary | ICD-10-CM | POA: Diagnosis present

## 2016-12-14 DIAGNOSIS — J9811 Atelectasis: Secondary | ICD-10-CM | POA: Diagnosis present

## 2016-12-14 DIAGNOSIS — E876 Hypokalemia: Secondary | ICD-10-CM | POA: Diagnosis present

## 2016-12-14 DIAGNOSIS — Z79899 Other long term (current) drug therapy: Secondary | ICD-10-CM

## 2016-12-14 DIAGNOSIS — J918 Pleural effusion in other conditions classified elsewhere: Secondary | ICD-10-CM | POA: Diagnosis not present

## 2016-12-14 DIAGNOSIS — F1990 Other psychoactive substance use, unspecified, uncomplicated: Secondary | ICD-10-CM | POA: Diagnosis present

## 2016-12-14 DIAGNOSIS — G894 Chronic pain syndrome: Secondary | ICD-10-CM | POA: Diagnosis present

## 2016-12-14 DIAGNOSIS — Z811 Family history of alcohol abuse and dependence: Secondary | ICD-10-CM

## 2016-12-14 DIAGNOSIS — Z4659 Encounter for fitting and adjustment of other gastrointestinal appliance and device: Secondary | ICD-10-CM

## 2016-12-14 DIAGNOSIS — B1920 Unspecified viral hepatitis C without hepatic coma: Secondary | ICD-10-CM | POA: Diagnosis present

## 2016-12-14 DIAGNOSIS — R768 Other specified abnormal immunological findings in serum: Secondary | ICD-10-CM | POA: Diagnosis present

## 2016-12-14 DIAGNOSIS — R609 Edema, unspecified: Secondary | ICD-10-CM | POA: Diagnosis not present

## 2016-12-14 DIAGNOSIS — E8770 Fluid overload, unspecified: Secondary | ICD-10-CM | POA: Diagnosis present

## 2016-12-14 DIAGNOSIS — M461 Sacroiliitis, not elsewhere classified: Secondary | ICD-10-CM | POA: Diagnosis present

## 2016-12-14 DIAGNOSIS — Z888 Allergy status to other drugs, medicaments and biological substances status: Secondary | ICD-10-CM | POA: Diagnosis not present

## 2016-12-14 DIAGNOSIS — R748 Abnormal levels of other serum enzymes: Secondary | ICD-10-CM | POA: Diagnosis present

## 2016-12-14 DIAGNOSIS — Z9889 Other specified postprocedural states: Secondary | ICD-10-CM | POA: Diagnosis not present

## 2016-12-14 DIAGNOSIS — Z79891 Long term (current) use of opiate analgesic: Secondary | ICD-10-CM

## 2016-12-14 DIAGNOSIS — Y95 Nosocomial condition: Secondary | ICD-10-CM | POA: Diagnosis not present

## 2016-12-14 DIAGNOSIS — M00061 Staphylococcal arthritis, right knee: Secondary | ICD-10-CM | POA: Diagnosis present

## 2016-12-14 DIAGNOSIS — Z01818 Encounter for other preprocedural examination: Secondary | ICD-10-CM | POA: Diagnosis not present

## 2016-12-14 DIAGNOSIS — J9601 Acute respiratory failure with hypoxia: Secondary | ICD-10-CM | POA: Diagnosis present

## 2016-12-14 DIAGNOSIS — B9561 Methicillin susceptible Staphylococcus aureus infection as the cause of diseases classified elsewhere: Secondary | ICD-10-CM | POA: Diagnosis not present

## 2016-12-14 DIAGNOSIS — I368 Other nonrheumatic tricuspid valve disorders: Secondary | ICD-10-CM

## 2016-12-14 DIAGNOSIS — I330 Acute and subacute infective endocarditis: Secondary | ICD-10-CM | POA: Diagnosis present

## 2016-12-14 DIAGNOSIS — F418 Other specified anxiety disorders: Secondary | ICD-10-CM | POA: Diagnosis present

## 2016-12-14 DIAGNOSIS — Z452 Encounter for adjustment and management of vascular access device: Secondary | ICD-10-CM

## 2016-12-14 DIAGNOSIS — D649 Anemia, unspecified: Secondary | ICD-10-CM | POA: Diagnosis present

## 2016-12-14 DIAGNOSIS — B9689 Other specified bacterial agents as the cause of diseases classified elsewhere: Secondary | ICD-10-CM | POA: Diagnosis present

## 2016-12-14 DIAGNOSIS — J15211 Pneumonia due to Methicillin susceptible Staphylococcus aureus: Secondary | ICD-10-CM | POA: Diagnosis not present

## 2016-12-14 DIAGNOSIS — F17210 Nicotine dependence, cigarettes, uncomplicated: Secondary | ICD-10-CM | POA: Diagnosis present

## 2016-12-14 DIAGNOSIS — K219 Gastro-esophageal reflux disease without esophagitis: Secondary | ICD-10-CM | POA: Diagnosis present

## 2016-12-14 DIAGNOSIS — M009 Pyogenic arthritis, unspecified: Secondary | ICD-10-CM | POA: Diagnosis present

## 2016-12-14 DIAGNOSIS — Z720 Tobacco use: Secondary | ICD-10-CM | POA: Diagnosis present

## 2016-12-14 DIAGNOSIS — I2782 Chronic pulmonary embolism: Secondary | ICD-10-CM | POA: Diagnosis not present

## 2016-12-14 DIAGNOSIS — Z882 Allergy status to sulfonamides status: Secondary | ICD-10-CM | POA: Diagnosis not present

## 2016-12-14 DIAGNOSIS — J9690 Respiratory failure, unspecified, unspecified whether with hypoxia or hypercapnia: Secondary | ICD-10-CM | POA: Diagnosis not present

## 2016-12-14 DIAGNOSIS — I2699 Other pulmonary embolism without acute cor pulmonale: Secondary | ICD-10-CM | POA: Diagnosis present

## 2016-12-14 DIAGNOSIS — I2690 Septic pulmonary embolism without acute cor pulmonale: Secondary | ICD-10-CM | POA: Diagnosis present

## 2016-12-14 DIAGNOSIS — D638 Anemia in other chronic diseases classified elsewhere: Secondary | ICD-10-CM | POA: Diagnosis present

## 2016-12-14 DIAGNOSIS — J90 Pleural effusion, not elsewhere classified: Secondary | ICD-10-CM | POA: Diagnosis present

## 2016-12-14 DIAGNOSIS — J189 Pneumonia, unspecified organism: Secondary | ICD-10-CM | POA: Diagnosis present

## 2016-12-14 DIAGNOSIS — F1910 Other psychoactive substance abuse, uncomplicated: Secondary | ICD-10-CM | POA: Diagnosis not present

## 2016-12-14 DIAGNOSIS — A4101 Sepsis due to Methicillin susceptible Staphylococcus aureus: Principal | ICD-10-CM | POA: Diagnosis present

## 2016-12-14 DIAGNOSIS — M00062 Staphylococcal arthritis, left knee: Secondary | ICD-10-CM | POA: Diagnosis not present

## 2016-12-14 LAB — CBC
HCT: 33.6 % — ABNORMAL LOW (ref 36.0–46.0)
HEMOGLOBIN: 10.6 g/dL — AB (ref 12.0–15.0)
MCH: 27.6 pg (ref 26.0–34.0)
MCHC: 31.5 g/dL (ref 30.0–36.0)
MCV: 87.5 fL (ref 78.0–100.0)
Platelets: 463 10*3/uL — ABNORMAL HIGH (ref 150–400)
RBC: 3.84 MIL/uL — AB (ref 3.87–5.11)
RDW: 15.5 % (ref 11.5–15.5)
WBC: 13.7 10*3/uL — AB (ref 4.0–10.5)

## 2016-12-14 LAB — HEPATITIS C ANTIBODY (REFLEX): HCV Ab: >11 s/co ratio — ABNORMAL HIGH (ref 0.0–0.9)

## 2016-12-14 LAB — LACTIC ACID, PLASMA: LACTIC ACID, VENOUS: 1.6 mmol/L (ref 0.5–1.9)

## 2016-12-14 LAB — POCT I-STAT 3, ART BLOOD GAS (G3+)
Acid-base deficit: 4 mmol/L — ABNORMAL HIGH (ref 0.0–2.0)
Bicarbonate: 23.1 mmol/L (ref 20.0–28.0)
O2 Saturation: 100 %
PH ART: 7.256 — AB (ref 7.350–7.450)
TCO2: 25 mmol/L (ref 0–100)
pCO2 arterial: 51.9 mmHg — ABNORMAL HIGH (ref 32.0–48.0)
pO2, Arterial: 293 mmHg — ABNORMAL HIGH (ref 83.0–108.0)

## 2016-12-14 LAB — URINE CULTURE: Culture: <10000 — AB

## 2016-12-14 LAB — HEPATITIS B SURFACE ANTIGEN: Hepatitis B Surface Ag: NEGATIVE

## 2016-12-14 LAB — COMMENT2 - HEP PANEL

## 2016-12-14 MED ORDER — ROCURONIUM BROMIDE 50 MG/5ML IV SOLN
1.0000 mg/kg | Freq: Once | INTRAVENOUS | Status: AC
  Administered 2016-12-14: 89.6 mg via INTRAVENOUS
  Filled 2016-12-14: qty 8.96

## 2016-12-14 MED ORDER — PROPOFOL 1000 MG/100ML IV EMUL
INTRAVENOUS | Status: AC
  Administered 2016-12-14: 90 ug/kg/min via INTRAVENOUS
  Filled 2016-12-14: qty 100

## 2016-12-14 MED ORDER — PANTOPRAZOLE SODIUM 40 MG IV SOLR
40.0000 mg | Freq: Two times a day (BID) | INTRAVENOUS | Status: DC
  Administered 2016-12-15 – 2016-12-18 (×7): 40 mg via INTRAVENOUS
  Filled 2016-12-14 (×7): qty 40

## 2016-12-14 MED ORDER — SODIUM CHLORIDE 0.9 % IV BOLUS (SEPSIS)
500.0000 mL | Freq: Once | INTRAVENOUS | Status: AC
  Administered 2016-12-15: 500 mL via INTRAVENOUS

## 2016-12-14 MED ORDER — LEVALBUTEROL HCL 0.63 MG/3ML IN NEBU
0.6300 mg | INHALATION_SOLUTION | RESPIRATORY_TRACT | Status: DC | PRN
  Administered 2016-12-17 – 2016-12-18 (×3): 0.63 mg via RESPIRATORY_TRACT
  Filled 2016-12-14 (×4): qty 3

## 2016-12-14 MED ORDER — FENTANYL CITRATE (PF) 100 MCG/2ML IJ SOLN
100.0000 ug | Freq: Once | INTRAMUSCULAR | Status: AC
  Administered 2016-12-14: 100 ug via INTRAVENOUS

## 2016-12-14 MED ORDER — MIDAZOLAM HCL 2 MG/2ML IJ SOLN
INTRAMUSCULAR | Status: AC
  Administered 2016-12-14: 4 mg
  Filled 2016-12-14: qty 4

## 2016-12-14 MED ORDER — SODIUM CHLORIDE 0.9 % IV SOLN
INTRAVENOUS | Status: DC
  Administered 2016-12-15: 01:00:00 via INTRAVENOUS

## 2016-12-14 MED ORDER — FENTANYL CITRATE (PF) 100 MCG/2ML IJ SOLN
INTRAMUSCULAR | Status: AC
  Administered 2016-12-14: 100 ug
  Filled 2016-12-14: qty 2

## 2016-12-14 MED ORDER — PROPOFOL 1000 MG/100ML IV EMUL
5.0000 ug/kg/min | INTRAVENOUS | Status: DC
  Administered 2016-12-14: 90 ug/kg/min via INTRAVENOUS
  Administered 2016-12-15: 30 ug/kg/min via INTRAVENOUS
  Administered 2016-12-15: 15 ug/kg/min via INTRAVENOUS
  Administered 2016-12-15: 40 ug/kg/min via INTRAVENOUS
  Administered 2016-12-15: 20 ug/kg/min via INTRAVENOUS
  Administered 2016-12-16: 40 ug/kg/min via INTRAVENOUS
  Administered 2016-12-16: 20 ug/kg/min via INTRAVENOUS
  Administered 2016-12-16: 35 ug/kg/min via INTRAVENOUS
  Administered 2016-12-17: 40 ug/kg/min via INTRAVENOUS
  Filled 2016-12-14 (×9): qty 100

## 2016-12-14 NOTE — Progress Notes (Signed)
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 Patient also questionable has a gastrointestinal bleed. Repeat by report patient has been found to have a pulmonary embolism on CT angiogram from outside facility. It's unknown whether or not the patient has any evidence of right heart strain. Patient also having hemoptysis. Hospitalist requesting intensivist at bedside stat for endotracheal intubation.   eICU Interventions  Intensivist at bedside.      Intervention Category Evaluation Type: New Patient Evaluation  Lawanda CousinsJennings Rafael Salway 12/14/2016, 10:55 PM

## 2016-12-15 ENCOUNTER — Inpatient Hospital Stay (HOSPITAL_COMMUNITY): Payer: Medicaid Other

## 2016-12-15 ENCOUNTER — Encounter (HOSPITAL_COMMUNITY): Payer: Self-pay | Admitting: Radiology

## 2016-12-15 DIAGNOSIS — Z882 Allergy status to sulfonamides status: Secondary | ICD-10-CM

## 2016-12-15 DIAGNOSIS — I2699 Other pulmonary embolism without acute cor pulmonale: Secondary | ICD-10-CM

## 2016-12-15 DIAGNOSIS — F17210 Nicotine dependence, cigarettes, uncomplicated: Secondary | ICD-10-CM | POA: Diagnosis present

## 2016-12-15 DIAGNOSIS — F1910 Other psychoactive substance abuse, uncomplicated: Secondary | ICD-10-CM

## 2016-12-15 DIAGNOSIS — J9601 Acute respiratory failure with hypoxia: Secondary | ICD-10-CM | POA: Diagnosis present

## 2016-12-15 DIAGNOSIS — J9811 Atelectasis: Secondary | ICD-10-CM | POA: Insufficient documentation

## 2016-12-15 DIAGNOSIS — F1990 Other psychoactive substance use, unspecified, uncomplicated: Secondary | ICD-10-CM

## 2016-12-15 DIAGNOSIS — I330 Acute and subacute infective endocarditis: Secondary | ICD-10-CM

## 2016-12-15 DIAGNOSIS — B9561 Methicillin susceptible Staphylococcus aureus infection as the cause of diseases classified elsewhere: Secondary | ICD-10-CM

## 2016-12-15 DIAGNOSIS — Z811 Family history of alcohol abuse and dependence: Secondary | ICD-10-CM

## 2016-12-15 DIAGNOSIS — M00062 Staphylococcal arthritis, left knee: Secondary | ICD-10-CM

## 2016-12-15 DIAGNOSIS — R768 Other specified abnormal immunological findings in serum: Secondary | ICD-10-CM | POA: Diagnosis present

## 2016-12-15 DIAGNOSIS — I2690 Septic pulmonary embolism without acute cor pulmonale: Secondary | ICD-10-CM | POA: Diagnosis present

## 2016-12-15 DIAGNOSIS — Z886 Allergy status to analgesic agent status: Secondary | ICD-10-CM

## 2016-12-15 DIAGNOSIS — M461 Sacroiliitis, not elsewhere classified: Secondary | ICD-10-CM

## 2016-12-15 DIAGNOSIS — I2782 Chronic pulmonary embolism: Secondary | ICD-10-CM

## 2016-12-15 DIAGNOSIS — Z9889 Other specified postprocedural states: Secondary | ICD-10-CM

## 2016-12-15 DIAGNOSIS — Z9911 Dependence on respirator [ventilator] status: Secondary | ICD-10-CM

## 2016-12-15 LAB — HCG, QUANTITATIVE, PREGNANCY: hCG, Beta Chain, Quant, S: <1 m[IU]/mL (ref <5)

## 2016-12-15 LAB — POCT I-STAT 3, ART BLOOD GAS (G3+)
Acid-base deficit: 2 mmol/L (ref 0.0–2.0)
Bicarbonate: 24.7 mmol/L (ref 20.0–28.0)
O2 Saturation: 100 %
Patient temperature: 97.7
TCO2: 26 mmol/L (ref 0–100)
pCO2 arterial: 49 mmHg — ABNORMAL HIGH (ref 32.0–48.0)
pH, Arterial: 7.309 — ABNORMAL LOW (ref 7.350–7.450)
pO2, Arterial: 183 mmHg — ABNORMAL HIGH (ref 83.0–108.0)

## 2016-12-15 LAB — CBC
HCT: 25.1 % — ABNORMAL LOW (ref 36.0–46.0)
HEMATOCRIT: 25.9 % — AB (ref 36.0–46.0)
HEMATOCRIT: 25.5 % — AB (ref 36.0–46.0)
HEMATOCRIT: 24.2 % — AB (ref 36.0–46.0)
HEMOGLOBIN: 8.1 g/dL — AB (ref 12.0–15.0)
HEMOGLOBIN: 7.9 g/dL — AB (ref 12.0–15.0)
HEMOGLOBIN: 7.5 g/dL — AB (ref 12.0–15.0)
Hemoglobin: 7.8 g/dL — ABNORMAL LOW (ref 12.0–15.0)
MCH: 27.2 pg (ref 26.0–34.0)
MCH: 27.1 pg (ref 26.0–34.0)
MCH: 27.2 pg (ref 26.0–34.0)
MCH: 27.1 pg (ref 26.0–34.0)
MCHC: 31.3 g/dL (ref 30.0–36.0)
MCHC: 31 g/dL (ref 30.0–36.0)
MCHC: 31.1 g/dL (ref 30.0–36.0)
MCHC: 31 g/dL (ref 30.0–36.0)
MCV: 86.9 fL (ref 78.0–100.0)
MCV: 87.5 fL (ref 78.0–100.0)
MCV: 87.6 fL (ref 78.0–100.0)
MCV: 87.4 fL (ref 78.0–100.0)
Platelets: 280 10*3/uL (ref 150–400)
Platelets: 238 10*3/uL (ref 150–400)
Platelets: 239 10*3/uL (ref 150–400)
Platelets: 231 10*3/uL (ref 150–400)
RBC: 2.98 MIL/uL — ABNORMAL LOW (ref 3.87–5.11)
RBC: 2.87 MIL/uL — AB (ref 3.87–5.11)
RBC: 2.91 MIL/uL — AB (ref 3.87–5.11)
RBC: 2.77 MIL/uL — ABNORMAL LOW (ref 3.87–5.11)
RDW: 15.1 % (ref 11.5–15.5)
RDW: 15.3 % (ref 11.5–15.5)
RDW: 15.6 % — ABNORMAL HIGH (ref 11.5–15.5)
RDW: 15.6 % — ABNORMAL HIGH (ref 11.5–15.5)
WBC: 8 10*3/uL (ref 4.0–10.5)
WBC: 7.2 10*3/uL (ref 4.0–10.5)
WBC: 7.3 10*3/uL (ref 4.0–10.5)
WBC: 6 10*3/uL (ref 4.0–10.5)

## 2016-12-15 LAB — APTT: aPTT: 89 seconds — ABNORMAL HIGH (ref 24–36)

## 2016-12-15 LAB — MRSA PCR SCREENING: MRSA by PCR: NEGATIVE

## 2016-12-15 LAB — URINALYSIS, ROUTINE W REFLEX MICROSCOPIC
Bilirubin Urine: NEGATIVE
GLUCOSE, UA: NEGATIVE mg/dL
KETONES UR: NEGATIVE mg/dL
LEUKOCYTES UA: NEGATIVE
NITRITE: NEGATIVE
PH: 5 (ref 5.0–8.0)
Protein, ur: 100 mg/dL — AB
Specific Gravity, Urine: 1.044 — ABNORMAL HIGH (ref 1.005–1.030)

## 2016-12-15 LAB — GLUCOSE, CAPILLARY
Glucose-Capillary: 114 mg/dL — ABNORMAL HIGH (ref 65–99)
Glucose-Capillary: 116 mg/dL — ABNORMAL HIGH (ref 65–99)

## 2016-12-15 LAB — BASIC METABOLIC PANEL
Anion gap: 11 (ref 5–15)
BUN: 8 mg/dL (ref 6–20)
CALCIUM: 8.4 mg/dL — AB (ref 8.9–10.3)
CHLORIDE: 106 mmol/L (ref 101–111)
CO2: 22 mmol/L (ref 22–32)
CREATININE: 0.99 mg/dL (ref 0.44–1.00)
GFR calc non Af Amer: >60 mL/min (ref >60)
Glucose, Bld: 126 mg/dL — ABNORMAL HIGH (ref 65–99)
Potassium: 4.8 mmol/L (ref 3.5–5.1)
Sodium: 139 mmol/L (ref 135–145)

## 2016-12-15 LAB — TYPE AND SCREEN
ABO/RH(D): O POS
Antibody Screen: NEGATIVE

## 2016-12-15 LAB — HEPARIN LEVEL (UNFRACTIONATED): Heparin Unfractionated: <0.1 IU/mL — ABNORMAL LOW (ref 0.30–0.70)

## 2016-12-15 LAB — PROTIME-INR
INR: 1.34
PROTHROMBIN TIME: 16.7 s — AB (ref 11.4–15.2)

## 2016-12-15 LAB — TRIGLYCERIDES: Triglycerides: 208 mg/dL — ABNORMAL HIGH (ref <150)

## 2016-12-15 LAB — TROPONIN I
Troponin I: 0.03 ng/mL (ref <0.03)
Troponin I: <0.03 ng/mL (ref <0.03)
Troponin I: <0.03 ng/mL (ref <0.03)

## 2016-12-15 LAB — BRAIN NATRIURETIC PEPTIDE: B Natriuretic Peptide: 2570.2 pg/mL — ABNORMAL HIGH (ref 0.0–100.0)

## 2016-12-15 MED ORDER — ORAL CARE MOUTH RINSE
15.0000 mL | Freq: Four times a day (QID) | OROMUCOSAL | Status: DC
  Administered 2016-12-15 (×2): 15 mL via OROMUCOSAL

## 2016-12-15 MED ORDER — VANCOMYCIN HCL IN DEXTROSE 1-5 GM/200ML-% IV SOLN
1000.0000 mg | Freq: Three times a day (TID) | INTRAVENOUS | Status: DC
  Administered 2016-12-15 – 2016-12-17 (×7): 1000 mg via INTRAVENOUS
  Filled 2016-12-15 (×9): qty 200

## 2016-12-15 MED ORDER — FENTANYL CITRATE (PF) 100 MCG/2ML IJ SOLN
INTRAMUSCULAR | Status: AC
  Administered 2016-12-15: 200 ug
  Filled 2016-12-15: qty 4

## 2016-12-15 MED ORDER — SODIUM CHLORIDE 0.9 % IV SOLN
25.0000 ug/h | INTRAVENOUS | Status: DC
  Administered 2016-12-15: 25 ug/h via INTRAVENOUS
  Administered 2016-12-15: 175 ug/h via INTRAVENOUS
  Administered 2016-12-16 (×2): 250 ug/h via INTRAVENOUS
  Administered 2016-12-17: 25 ug/h via INTRAVENOUS
  Administered 2016-12-17: 250 ug/h via INTRAVENOUS
  Filled 2016-12-15 (×5): qty 50

## 2016-12-15 MED ORDER — HEPARIN BOLUS VIA INFUSION
2000.0000 [IU] | Freq: Once | INTRAVENOUS | Status: AC
  Administered 2016-12-15: 2000 [IU] via INTRAVENOUS
  Filled 2016-12-15: qty 2000

## 2016-12-15 MED ORDER — VANCOMYCIN HCL IN DEXTROSE 1-5 GM/200ML-% IV SOLN
1000.0000 mg | Freq: Once | INTRAVENOUS | Status: AC
  Administered 2016-12-15: 1000 mg via INTRAVENOUS
  Filled 2016-12-15: qty 200

## 2016-12-15 MED ORDER — PIPERACILLIN-TAZOBACTAM 3.375 G IVPB
3.3750 g | Freq: Three times a day (TID) | INTRAVENOUS | Status: DC
  Administered 2016-12-15 – 2016-12-16 (×3): 3.375 g via INTRAVENOUS
  Filled 2016-12-15 (×5): qty 50

## 2016-12-15 MED ORDER — IOPAMIDOL (ISOVUE-370) INJECTION 76%
INTRAVENOUS | Status: AC
  Administered 2016-12-15: 100 mL
  Filled 2016-12-15: qty 100

## 2016-12-15 MED ORDER — HEPARIN (PORCINE) IN NACL 100-0.45 UNIT/ML-% IJ SOLN
1600.0000 [IU]/h | INTRAMUSCULAR | Status: DC
  Administered 2016-12-15: 1100 [IU]/h via INTRAVENOUS
  Administered 2016-12-16: 1300 [IU]/h via INTRAVENOUS
  Filled 2016-12-15 (×2): qty 250

## 2016-12-15 MED ORDER — CHLORHEXIDINE GLUCONATE 0.12% ORAL RINSE (MEDLINE KIT)
15.0000 mL | Freq: Two times a day (BID) | OROMUCOSAL | Status: DC
  Administered 2016-12-15: 15 mL via OROMUCOSAL

## 2016-12-15 MED ORDER — CHLORHEXIDINE GLUCONATE 0.12% ORAL RINSE (MEDLINE KIT)
15.0000 mL | Freq: Two times a day (BID) | OROMUCOSAL | Status: DC
  Administered 2016-12-15 – 2016-12-17 (×5): 15 mL via OROMUCOSAL

## 2016-12-15 MED ORDER — ORAL CARE MOUTH RINSE
15.0000 mL | OROMUCOSAL | Status: DC
  Administered 2016-12-15 – 2016-12-17 (×20): 15 mL via OROMUCOSAL

## 2016-12-15 MED ORDER — CHLORHEXIDINE GLUCONATE CLOTH 2 % EX PADS
6.0000 | MEDICATED_PAD | Freq: Every day | CUTANEOUS | Status: DC
  Administered 2016-12-15 – 2016-12-18 (×5): 6 via TOPICAL

## 2016-12-15 MED ORDER — SODIUM CHLORIDE 0.9% FLUSH
10.0000 mL | Freq: Two times a day (BID) | INTRAVENOUS | Status: DC
  Administered 2016-12-15 – 2016-12-16 (×2): 10 mL
  Administered 2016-12-17: 30 mL
  Administered 2016-12-17 – 2016-12-18 (×2): 10 mL

## 2016-12-15 MED ORDER — SODIUM CHLORIDE 0.9% FLUSH
10.0000 mL | INTRAVENOUS | Status: DC | PRN
  Administered 2016-12-18: 10 mL
  Filled 2016-12-15: qty 40

## 2016-12-15 MED ORDER — IOPAMIDOL (ISOVUE-370) INJECTION 76%
INTRAVENOUS | Status: AC
  Filled 2016-12-15: qty 100

## 2016-12-15 MED ORDER — PIPERACILLIN-TAZOBACTAM 3.375 G IVPB 30 MIN
3.3750 g | Freq: Once | INTRAVENOUS | Status: AC
  Administered 2016-12-15: 3.375 g via INTRAVENOUS
  Filled 2016-12-15: qty 50

## 2016-12-15 MED ORDER — ENOXAPARIN SODIUM 100 MG/ML ~~LOC~~ SOLN: 1.0000 mg/kg | Freq: Two times a day (BID) | SUBCUTANEOUS | Status: DC

## 2016-12-15 MED ORDER — CHLORHEXIDINE GLUCONATE 0.12 % MT SOLN
OROMUCOSAL | Status: AC
  Filled 2016-12-15: qty 15

## 2016-12-15 NOTE — Progress Notes (Signed)
Bladder scan shows volume of 0ml.

## 2016-12-15 NOTE — H&P (Addendum)
PULMONARY / CRITICAL CARE MEDICINE   Name: Gabriella Ramos MRN: [id number] DOB: 1976-04-07    ADMISSION DATE:  12/14/2016   CHIEF COMPLAINT:  Left knee pain  HISTORY OF PRESENT ILLNESS:   The patient was immediately intubated upon my arrival and the HPI is taken from the hospitalist H&P as well as report from the hospitalist in person.   41 y.o. female with medical history significant of polysubstance abuse, tobacco abuse, anemia, IVDU, chronic pain syndrome, depression, anxiety, GERD, constipation, who presents with left knee pain and swelling.  Patient was recently admitted to [facility] on 11/25/16 due to right lower lobe pneumonia with multiple bilateral foci. She was found to have acute endocarditis with a fragile large tricuspid vegetation (MSSA on Bcx) that it was 2 cm in size by 2-D echo. She was transferred to [facility] for continuation of IV Abx of Cefazoline for total of 8 weeks with a stop date of 01/13/17. While in [facility] hospital, patient complains of left kidney swelling and pain. Left knee joint aspiration was done, and culture of synovial fluid grew out methicillin-sensitive staph aureus. Ortho was consulted in [facility] hospital and recommended knee washout. Per report, pt has anemia, hemolytic anemia workup tests including indirect bilirubin, LDH, haptoglobin and peripheral smear were sent out, the results are pending. Pt was transfused with one units of blood on 12/09/16. Her Hgb was 7.9 on 12/10/16. Pt had negative left LE venous doppler for DVT on 12/05/16.  Per the [facility] transfer note (in patients chart), on 12/14/2016 she reported pleuritic chest pain and SOB.  CT PE demonstrated Right pulmonary embolism.  Several days ago at [facility] she was noted to have a hemoglobin drop to 6 where she received 2U PRBCs.  No obvious sources of bleeding were found and hemolysis labs negative.  She arrived on the floor and was found to be in acute distress with severe work of  breathing and 1 word dyspnea, we elected to intubate immediately.   PAST MEDICAL HISTORY :   has a past medical history of Chronic pain syndrome; Depression with anxiety; IVDU (intravenous drug user); and Tobacco abuse.  has a past surgical history that includes Kidney surgery; Cesarean section; Appendectomy; and Irrigation and debridement knee (Left, 12/11/2016). Prior to Admission medications   Medication Sig Start Date End Date Taking? Authorizing Provider  ALPRAZolam Prudy Feeler) 1 MG tablet Take 1 mg by mouth 3 (three) times daily. 12/06/16   Historical Provider, MD  carisoprodol (SOMA) 350 MG tablet Take 350 mg by mouth 3 (three) times daily. 11/07/16   Historical Provider, MD  diphenhydrAMINE (BENADRYL) 25 mg capsule Take 25 mg by mouth every 6 (six) hours as needed for itching.    Historical Provider, MD  ibuprofen (ADVIL,MOTRIN) 200 MG tablet Take 200 mg by mouth every 6 (six) hours as needed for moderate pain.    Historical Provider, MD  Menthol, Topical Analgesic, (ICY HOT EX) Apply 1 application topically daily as needed (pain).    Historical Provider, MD  oxymorphone (OPANA ER) 20 MG 12 hr tablet Take 20 mg by mouth every 12 (twelve) hours.    Historical Provider, MD   Allergies  Allergen Reactions  . Ketorolac Tromethamine Shortness Of Breath  . Sulfa Antibiotics     FAMILY HISTORY:  indicated that the status of her mother is unknown.   SOCIAL HISTORY:    REVIEW OF SYSTEMS:   Unable to obtain  SUBJECTIVE:   VITAL SIGNS: Temp:  [98.8 F (37.1 C)]  98.8 F (37.1 C) (02/11 0000) Pulse Rate:  [120] 120 (02/11 0000) Resp:  [18] 18 (02/11 0000) BP: (143)/(111) 143/111 (02/11 0000) SpO2:  [94 %-97 %] 97 % (02/11 0000) FiO2 (%):  [60 %] 60 % (02/10 2335) Weight:  [90.8 kg (200 lb 2.8 oz)] 90.8 kg (200 lb 2.8 oz) (02/11 0000) HEMODYNAMICS:   VENTILATOR SETTINGS: Vent Mode: PRVC FiO2 (%):  [60 %] 60 % Set Rate:  [18 bmp] 18 bmp Vt Set:  [440 mL] 440 mL PEEP:  [5 cmH20] 5  cmH20 Plateau Pressure:  [38 cmH20] 38 cmH20 INTAKE / OUTPUT: No intake or output data in the 24 hours ending 12/15/16 0118  PHYSICAL EXAMINATION: General:  Severe acute respiratory distress Neuro:  Unable to perform.  No obvious focal deficits.  Was able to say "yes" and "no" appropriately to questions. HEENT:  Dry MM, Mallampati 3, no icterus  Cardiovascular:  Tachycardic, no m/r/g Lungs:  Course breath sounds bilaterally, no w/r/r Abdomen:  Soft, non tender, normal bowel sounds.  Eccymosis noted in the periumbilical region Skin:  No jaundice  LABS:  CBC  Recent Labs Lab 12/12/16 0424 12/13/16 0426 12/14/16 2258  WBC 5.5 6.1 13.7*  HGB 8.0* 8.7* 10.6*  HCT 25.4* 27.8* 33.6*  PLT 312 319 463*   Coag's  Recent Labs Lab 12/11/16 0023 12/14/16 2258  APTT 52* 89*  INR 1.28 1.34   BMET  Recent Labs Lab 12/12/16 0424 12/13/16 0426 12/14/16 2258  NA 137 136 139  K 4.5 4.2 4.8  CL 107 105 106  CO2 22 21* 22  BUN 11 9 8   CREATININE 0.87 0.86 0.99  GLUCOSE 91 95 126*   Electrolytes  Recent Labs Lab 12/12/16 0424 12/13/16 0426 12/14/16 2258  CALCIUM 7.9* 8.0* 8.4*   Sepsis Markers  Recent Labs Lab 12/11/16 0023 12/14/16 2330  LATICACIDVEN 0.7 1.6   ABG  Recent Labs Lab 12/14/16 2255  PHART 7.256*  PCO2ART 51.9*  PO2ART 293.0*   Liver Enzymes  Recent Labs Lab 12/12/16 0424  AST 10*  ALT <5*  ALKPHOS 105  BILITOT 0.2*  ALBUMIN 1.5*   Cardiac Enzymes  Recent Labs Lab 12/14/16 2258  TROPONINI 0.03*   Glucose  Recent Labs Lab 12/11/16 1125 12/12/16 0804 12/13/16 0748  GLUCAP 85 99 97    Imaging Dg Abd 1 View  Result Date: 12/14/2016 CLINICAL DATA:  Endotracheal and NGT placements. EXAM: ABDOMEN - 1 VIEW COMPARISON:  CT abdomen and pelvis 10/28/2014 FINDINGS: Enteric tube is present. The tip is below the field of view but likely is in the distal stomach. Proximal side hole is in the body of the stomach. Incidental note of  intubation of the right mainstem bronchus. See additional chest x-ray report. Bilateral pulmonary infiltrates and effusions. IMPRESSION: Enteric tube tip is off the field of view but location is consistent with distal stomach. Electronically Signed   By: Gabriella NievesWilliam  Ramos M.D.   On: 12/14/2016 23:49   Dg Chest Port 1 View  Result Date: 12/14/2016 CLINICAL DATA:  Endotracheal and NG tube placements. EXAM: PORTABLE CHEST 1 VIEW COMPARISON:  12/13/2016 FINDINGS: Endotracheal tube tip is in the right mainstem bronchus and knees to be pulled back at least 5 cm. Enteric tube tip is off the field of view. Cardiac enlargement with pulmonary vascular congestion and bilateral perihilar infiltrates, progressing since previous study. This is likely edema. Small bilateral pleural effusions are present. Volume loss in the left lung. No pneumothorax. There appears to be  a PICC line with tip in the right upper arm. IMPRESSION: Endotracheal tube tip in the right mainstem bronchus and needs to be pulled back. Cardiac enlargement with increasing vascular congestion and bilateral perihilar edema since previous study. Bilateral pleural effusions are also progressing. Volume loss in the left lung. These results were called by telephone at the time of interpretation on 12/14/2016 at 11:43 pm to Jody, Patients nurse on [address], who verbally acknowledged these results. Electronically Signed   By: Gabriella Nieves M.D.   On: 12/14/2016 23:55     ASSESSMENT / PLAN: 41 yo CF with bacterial endocarditis and acute hypoxemic respiratory failure and increased WOB requiring emergent intubation.  The communication from the transferring institution was quite poor, no nursing sign out was given.   PULMONARY A: Acute hypoxemic respiratory failure Right lung white out after right mainstem intubation Submassive PE   P:   - ETT withdrawn 2cm - Low TV ventilation  - Ventilator management bundle - diuresis 1-2L net negative - TTE -  Heparin gtt with high risk bleeding protocol without bolus given concerns of recent, unexplained hemoglobin drop and bloody appearance of urine.  - not a candidate for thrombolysis (also no indication at this time) - Trend troponin - Obtain CT images from [facility] tomorrow  CARDIOVASCULAR A:  Bacterial endocarditis TV vegitation Elevated troponin and BNP - 2/2 submassive PE  P:   - TTE  - blood cultures - Vanc/zosyn - ID consult in AM  RENAL A:   Not active P:   - Foley cath -   GASTROINTESTINAL A:   Not active  ** There were reports of a GI bleed from [facility], but this was not confirmed by [facility] staff, at [facility] there was not e/o GI Bleed on assessment.  Given the submassive PE we are anticoagulating.  P:   - LFTs pending  HEMATOLOGIC A:   Anemia of chronic disease ** She did have a reported drop of her Hemoglobin at [facility] and received 2U PRBCs without an obvious source.  Her urine on gross inspection appears bloody.  Given the risks of not being anticoagulated we will start a heparin gtt with NO bolus and monitor her Urin and CBC carefully. P:  - trend CBC  INFECTIOUS A:   MSSA - TV bacterial endocarditis Started Cefazolin on 11/18/2016 plan was for 8 weeks with stop date 01/13/2017  MSSA - Right knee septic arthritis s/p washout  P:   BCx2 - 12/15/2016 UC 12/15/2016 Sputum 12/15/2016 Abx:  Change to Vanc/ zosyn, pending repeat Bcx - Has picc line in placed placed after cultures cleared (reportedly)  ENDOCRINE A:   Not active   P:   - BG <180  NEUROLOGIC A:   Sedated on propofol P:   RASS goal: 0 - pain, agitation, delerium order set activated.     Total critical care time: 40 min  Critical care time was exclusive of separately billable procedures and treating other patients.  Critical care was necessary to treat or prevent imminent or life-threatening deterioration.  Critical care was time spent personally by me on the following  activities: development of treatment plan with patient and/or surrogate as well as nursing, discussions with consultants, evaluation of patient's response to treatment, examination of patient, obtaining history from patient or surrogate, ordering and performing treatments and interventions, ordering and review of laboratory studies, ordering and review of radiographic studies, pulse oximetry and re-evaluation of patient's condition.   Galvin Proffer, DO., MS  Pulmonary  and Critical Care Medicine       Pulmonary and Critical Care Medicine [facility] Pager: [phone]  12/15/2016, 1:18 AM

## 2016-12-15 NOTE — Procedures (Signed)
Central Venous Catheter Insertion Procedure Note Gabriella LeachRachel Lynn Ramos [id number] [birth year]  Procedure: Insertion of Central Venous Catheter Indications: Assessment of intravascular volume, Drug and/or fluid administration and Frequent blood sampling  Procedure Details Consent: Unable to obtain consent because of emergent medical necessity. Time Out: Verified patient identification, verified procedure, site/side was marked, verified correct patient position, special equipment/implants available, medications/allergies/relevent history reviewed, required imaging and test results available.  Performed  Maximum sterile technique was used including antiseptics, cap, gloves, gown, hand hygiene, mask and sheet. Skin prep: Chlorhexidine; local anesthetic administered A antimicrobial bonded/coated triple lumen catheter was placed in the left internal jugular vein using the Seldinger technique. Ultrasound guidance used.Yes.   Catheter placed to 20 cm. Blood aspirated via all 3 ports and then flushed x 3. Line sutured x 2 and dressing applied.  Evaluation Blood flow good Complications: No apparent complications Patient did tolerate procedure well. Chest X-ray ordered to verify placement.  CXR: pending.  Gabriella CanalesSteve Anushree Ramos ACNP Gabriella PollackLe Ramos PCCM Pager [phone] till 3 pm If no answer page [phone] 12/15/2016, 12:30 PM

## 2016-12-15 NOTE — Progress Notes (Signed)
ANTICOAGULATION CONSULT NOTE - Follow Up Consult  Pharmacy Consult for heparin Indication: pulmonary embolus  Allergies  Allergen Reactions  . Ketorolac Tromethamine Shortness Of Breath  . Sulfa Antibiotics     Patient Measurements: Height: 5' 4"  (162.6 cm) Weight: 200 lb 2.8 oz (90.8 kg) IBW/kg (Calculated) : 54.7 Heparin Dosing Weight: 75kg  Vital Signs: Temp: 98.7 F (37.1 C) (02/11 2000) Temp Source: Oral (02/11 2000) BP: 88/61 (02/11 2100) Pulse Rate: 81 (02/11 2100)  Labs:  Recent Labs  12/13/16 0426 12/14/16 2258 12/15/16 0922 12/15/16 1639 12/15/16 2018  HGB 8.7* 10.6* 8.1* 7.9*  7.8* 7.5*  HCT 27.8* 33.6* 25.9* 25.5*  25.1* 24.2*  PLT 319 463* 280 239  238 231  APTT  --  89*  --   --   --   LABPROT  --  16.7*  --   --   --   INR  --  1.34  --   --   --   HEPARINUNFRC  --   --   --   --  <0.10*  CREATININE 0.86 0.99  --   --   --   TROPONINI  --  0.03* <0.03 <0.03  --     Estimated Creatinine Clearance: 84 mL/min (by C-G formula based on SCr of 0.99 mg/dL).   Medical History: Past Medical History:  Diagnosis Date  . Chronic pain syndrome   . Depression with anxiety   . IVDU (intravenous drug user)   . Tobacco abuse     Medications:  Prescriptions Prior to Admission  Medication Sig Dispense Refill Last Dose  . ALPRAZolam (XANAX) 1 MG tablet Take 1 mg by mouth 3 (three) times daily.  5 Past Month at Unknown time  . carisoprodol (SOMA) 350 MG tablet Take 350 mg by mouth 3 (three) times daily.  5 Past Month at Unknown time  . diphenhydrAMINE (BENADRYL) 25 mg capsule Take 25 mg by mouth every 6 (six) hours as needed for itching.   Past Month at Unknown time  . ibuprofen (ADVIL,MOTRIN) 200 MG tablet Take 200 mg by mouth every 6 (six) hours as needed for moderate pain.   Past Month at Unknown time  . Menthol, Topical Analgesic, (ICY HOT EX) Apply 1 application topically daily as needed (pain).   Past Month at Unknown time  . oxymorphone (OPANA ER)  20 MG 12 hr tablet Take 20 mg by mouth every 12 (twelve) hours.   Past Month at Unknown time   Scheduled:  . chlorhexidine gluconate (MEDLINE KIT)  15 mL Mouth Rinse BID  . Chlorhexidine Gluconate Cloth  6 each Topical Daily  . iopamidol      . mouth rinse  15 mL Mouth Rinse QID  . mouth rinse  15 mL Mouth Rinse 10 times per day  . pantoprazole (PROTONIX) IV  40 mg Intravenous Q12H  . piperacillin-tazobactam (ZOSYN)  IV  3.375 g Intravenous Q8H  . sodium chloride flush  10-40 mL Intracatheter Q12H  . vancomycin  1,000 mg Intravenous Q8H   Infusions:  . sodium chloride 30 mL/hr at 12/15/16 0145  . fentaNYL infusion INTRAVENOUS 175 mcg/hr (12/15/16 2032)  . heparin 1,100 Units/hr (12/15/16 1531)  . propofol (DIPRIVAN) infusion 20 mcg/kg/min (12/15/16 2019)    Assessment: 41yo female was recently at [facility] for endocarditis/bacteremia >> tx'd to [facility] >> began having CP/SOB and PE found on CT complicated by Hgb down to 6, now back at [facility] ICU, [facility] reports scant hemoptysis, no clear findings  indicating GIB by CCM, Hgb now 10.6, at this point benefit > risk for anticoagulation though spoke w/ Dr Diona Fanti who recommends lower goal for now and if no signs of bleeding to increase goal heparin levels. Heparin drip started 1100 uts/hr but then turned off for central line placement  Restart and draw HL 6hr later.    Initial HL is undetectable on heparin 1100 units/hr. Nurse reports no issues with infusion or bleeding.  Goal of Therapy:  Heparin level 0.3-0.5 units/ml (will monitor w/ CCM to determine when to aim for higher goal) Monitor platelets by anticoagulation protocol: Yes   Plan:  Bolus heparin 2000 units and increase heparin to 1300 units/hr  6h HL Daily HL/CBC  Andrey Cota. Diona Foley, PharmD, BCPS Clinical Pharmacist 12/15/2016 10:20 PM

## 2016-12-15 NOTE — Progress Notes (Signed)
ETT pulled back from 27 at the lip to 23 at the lip. Sats improved from 92% to 98% after pull back.

## 2016-12-15 NOTE — Progress Notes (Signed)
eLink Physician-Brief Progress Note Patient Name: Gabriella LeachRachel Lynn Rosales DOB: Mar 23, 1976 MRN: [id number]   Date of Service  12/15/2016  HPI/Events of Note  Notified by nurse patient currently and CT scanner and has evidence of pneumothorax on CT imaging. Not present on prior x-ray by my review. Patient currently stable without desaturation and otherwise normal vital signs per nurse report.   eICU Interventions  Intensivist notified for chest tube placement as soon as possible pending transport to the ICU      Intervention Category Major Interventions: Other:  Lawanda CousinsJennings Stacey Sago 12/15/2016, 5:22 PM

## 2016-12-15 NOTE — Progress Notes (Signed)
Pharmacy Antibiotic Note  Gabriella MendsRachel Lynn Ramos is a 41 y.o. female admitted on 12/14/2016 with bacteremia, septic knee arthritis, and endocarditis.  Pt had been on Ancef w/ plan for 8wk w/ stop date of 3/13, now to broaden coverage.  Pharmacy has been consulted for Vancocin and Zosyn dosing.  Plan: Vancomycin 1000mg  IV every 8 hours.  Goal trough 15-20 mcg/mL. Zosyn 3.375g IV q8h (4 hour infusion).  Weight: 200 lb 2.8 oz (90.8 kg)  Temp (24hrs), Avg:98.8 F (37.1 C), Min:98.8 F (37.1 C), Max:98.8 F (37.1 C)   Recent Labs Lab 12/11/16 0023 12/11/16 0214 12/12/16 0424 12/13/16 0426 12/14/16 2258 12/14/16 2330  WBC 6.5 5.8 5.5 6.1 13.7*  --   CREATININE 0.97 0.98 0.87 0.86 0.99  --   LATICACIDVEN 0.7  --   --   --   --  1.6    Estimated Creatinine Clearance: 84 mL/min (by C-G formula based on SCr of 0.99 mg/dL).    Allergies  Allergen Reactions  . Ketorolac Tromethamine Shortness Of Breath  . Sulfa Antibiotics      Thank you for allowing pharmacy to be a part of this patient's care.  Gabriella GamblesVeronda Adryan Ramos, PharmD, BCPS  12/15/2016 4:00 AM

## 2016-12-15 NOTE — Progress Notes (Signed)
PULMONARY / CRITICAL CARE MEDICINE   Name: Gabriella Ramos MRN: [id number] DOB: 09-04-1976    ADMISSION DATE:  12/14/2016   CHIEF COMPLAINT:  Left knee pain  HISTORY OF PRESENT ILLNESS:   41 y.o. female with medical history significant of polysubstance abuse, tobacco abuse, anemia, IVDU, chronic pain syndrome, depression, anxiety, GERD, constipation, who presents with left knee pain and swelling.  Patient was [facility]16 due to right lower lobe pneumonia with multiple bilateral foci. She was found to have acute endocarditis with a fragile large tricuspid vegetation (MSSA on Bcx) that it was 2 cm in size by 2-D echo. She was [facility] for continuation of IV Abx of Cefazoline for total of 8 weeks with a stop date of 01/13/17. While in [facility] hospital, patient complains of left kidney swelling and pain. Left knee joint aspiration was done, and culture of synovial fluid grew out methicillin-sensitive staph aureus. Ortho was consulted in [facility] hospital and recommended knee washout. Per report, pt has anemia, hemolytic anemia workup tests including indirect bilirubin, LDH, haptoglobin and peripheral smear were sent out, the results are pending. Pt was transfused with one units of blood on 12/09/16. Her Hgb was 7.9 on 12/10/16. Pt had negative left LE venous doppler for DVT on 12/05/16.  Per the [facility] transfer note (in patients chart), on 12/14/2016 she reported pleuritic chest pain and SOB.  CT PE demonstrated Right pulmonary embolism.  Several days ago at [facility] she was noted to have a hemoglobin drop to 6 where she received 2U PRBCs.  No obvious sources of bleeding were found and hemolysis labs negative.  She arrived on the floor and was found to be in acute distress with severe work of breathing and 1 word dyspnea, we elected to intubate immediately.  SUBJECTIVE: Intubated overnight, sedate, no further events  VITAL SIGNS: Temp:   [97.7 F (36.5 C)-98.8 F (37.1 C)] 97.7 F (36.5 C) (02/11 0359) Pulse Rate:  [87-124] 87 (02/11 0843) Resp:  [0-21] 18 (02/11 0843) BP: (92-147)/(68-115) 92/68 (02/11 0843) SpO2:  [94 %-100 %] 100 % (02/11 0843) FiO2 (%):  [40 %-60 %] 40 % (02/11 0843) Weight:  [90.8 kg (200 lb 2.8 oz)] 90.8 kg (200 lb 2.8 oz) (02/11 0400)   HEMODYNAMICS:   VENTILATOR SETTINGS: Vent Mode: PRVC FiO2 (%):  [40 %-60 %] 40 % Set Rate:  [18 bmp] 18 bmp Vt Set:  [440 mL] 440 mL PEEP:  [5 cmH20] 5 cmH20 Plateau Pressure:  [24 cmH20-38 cmH20] 26 cmH20 INTAKE / OUTPUT:  Intake/Output Summary (Last 24 hours) at 12/15/16 0931 Last data filed at 12/15/16 0700  Gross per 24 hour  Intake          1082.51 ml  Output              675 ml  Net           407.51 ml   PHYSICAL EXAMINATION: General:  Acutely ill young female, NAD on propofol Neuro:  Sedate, withdraws all ext to command HEENT:  [place]/AT, PERRL, EOM-I and MMM Cardiovascular:  RRR, Nl S1/S2, -M/R/G. Lungs:  Coarse BS diffusely Abdomen:  Soft, NT, ND and +BS Skin:  Intact Ext: no toe nails  LABS:  CBC  Recent Labs Lab 12/12/16 0424 12/13/16 0426 12/14/16 2258  WBC 5.5 6.1 13.7*  HGB 8.0* 8.7* 10.6*  HCT 25.4* 27.8* 33.6*  PLT 312 319 463*   Coag's  Recent Labs Lab 12/11/16 0023  12/14/16 2258  APTT 52* 89*  INR 1.28 1.34   BMET  Recent Labs Lab 12/12/16 0424 12/13/16 0426 12/14/16 2258  NA 137 136 139  K 4.5 4.2 4.8  CL 107 105 106  CO2 22 21* 22  BUN 11 9 8   CREATININE 0.87 0.86 0.99  GLUCOSE 91 95 126*   Electrolytes  Recent Labs Lab 12/12/16 0424 12/13/16 0426 12/14/16 2258  CALCIUM 7.9* 8.0* 8.4*   Sepsis Markers  Recent Labs Lab 12/11/16 0023 12/14/16 2330  LATICACIDVEN 0.7 1.6   ABG  Recent Labs Lab 12/14/16 2255 12/15/16 0512  PHART 7.256* 7.309*  PCO2ART 51.9* 49.0*  PO2ART 293.0* 183.0*   Liver Enzymes  Recent Labs Lab 12/12/16 0424  AST 10*  ALT <5*  ALKPHOS 105  BILITOT  0.2*  ALBUMIN 1.5*   Cardiac Enzymes  Recent Labs Lab 12/14/16 2258  TROPONINI 0.03*   Glucose  Recent Labs Lab 12/11/16 1125 12/12/16 0804 12/13/16 0748 12/15/16 0031 12/15/16 0409  GLUCAP 85 99 97 114* 116*   Imaging Dg Abd 1 View  Result Date: 12/14/2016 CLINICAL DATA:  Endotracheal and NGT placements. EXAM: ABDOMEN - 1 VIEW COMPARISON:  CT abdomen and pelvis 10/28/2014 FINDINGS: Enteric tube is present. The tip is below the field of view but likely is in the distal stomach. Proximal side hole is in the body of the stomach. Incidental note of intubation of the right mainstem bronchus. See additional chest x-ray report. Bilateral pulmonary infiltrates and effusions. IMPRESSION: Enteric tube tip is off the field of view but location is consistent with distal stomach. Electronically Signed   By: Burman Nieves M.D.   On: 12/14/2016 23:49   Dg Chest Port 1 View  Result Date: 12/15/2016 CLINICAL DATA:  Respiratory failure requiring intubation EXAM: PORTABLE CHEST 1 VIEW COMPARISON:  12/14/2016 FINDINGS: Endotracheal tube and remaining support devices are stable. Diffuse left lung airspace disease with near complete white out. Diffuse right lung airspace disease, slightly worsened since prior study. Findings could represent edema or infection. IMPRESSION: Near complete white out of the left lung with increasing diffuse right lung airspace disease. Electronically Signed   By: Charlett Nose M.D.   On: 12/15/2016 07:43   Dg Chest Port 1 View  Result Date: 12/14/2016 CLINICAL DATA:  Endotracheal and NG tube placements. EXAM: PORTABLE CHEST 1 VIEW COMPARISON:  12/13/2016 FINDINGS: Endotracheal tube tip is in the right mainstem bronchus and knees to be pulled back at least 5 cm. Enteric tube tip is off the field of view. Cardiac enlargement with pulmonary vascular congestion and bilateral perihilar infiltrates, progressing since previous study. This is likely edema. Small bilateral pleural  effusions are present. Volume loss in the left lung. No pneumothorax. There appears to be a PICC line with tip in the right upper arm. IMPRESSION: Endotracheal tube tip in the right mainstem bronchus and needs to be pulled back. Cardiac enlargement with increasing vascular congestion and bilateral perihilar edema since previous study. Bilateral pleural effusions are also progressing. Volume loss in the left lung. These results were called by telephone at the time of interpretation on 12/14/2016 at 11:43 pm to Jody, Patients nurse on [address], who verbally acknowledged these results. Electronically Signed   By: Burman Nieves M.D.   On: 12/14/2016 23:55   ASSESSMENT / PLAN: 41 yo CF with bacterial endocarditis and acute hypoxemic respiratory failure and increased WOB requiring emergent intubation.  The communication from the transferring institution was quite poor, no nursing sign out  was given.   PULMONARY A: Acute hypoxemic respiratory failure Right lung white out after right mainstem intubation Submassive PE   P:   - Bronch today for left lung collapse - Low TV ventilation  - CTA ordered to determine extent of PE - Diuresis 1-2L net negative, hold further diureses at this point - 2D echo ordered - If truly endocarditis will need CVTS to evaluate - Heparin gtt with high risk bleeding protocol without bolus given concerns of recent, unexplained hemoglobin drop and bloody appearance of urine.  - Not a candidate for thrombolysis (also no indication at this time) due to concern for GI bleed - Trend troponin  CARDIOVASCULAR A:  Bacterial endocarditis TV vegitation Elevated troponin and BNP - 2/2 submassive PE  P:  - Blood cultures - Vanc/zosyn - ID consult called - 2D echo ordered  RENAL A:   Not active P:   - Foley cath - BMET in AM - Replace electrolytes as indicated  GASTROINTESTINAL A:   There were reports of a GI bleed from [facility], but this was not confirmed by [facility]  staff, at [facility] there was not e/o GI Bleed on assessment.  Given the submassive PE we are anticoagulating.  P:   - LFTs pending - Viral hepatitis panel - Stool OB - TF per nutrition  HEMATOLOGIC A:   Anemia of chronic disease ** She did have a reported drop of her Hemoglobin at [facility] and received 2U PRBCs without an obvious source.  Her urine on gross inspection appears bloody.  Given the risks of not being anticoagulated we will start a heparin gtt with NO bolus and monitor her Urin and CBC carefully. P:  - Trend CBC - Transfuse per ICU protocol  INFECTIOUS A:   MSSA - TV bacterial endocarditis Started Cefazolin on 11/18/2016 plan was for 8 weeks with stop date 01/13/2017  MSSA - Right knee septic arthritis s/p washout  P:   BCx2 - 12/15/2016 UC 12/15/2016 Sputum 12/15/2016 Abx:  Change to Vanc/ zosyn, pending repeat Bcx Insert PICC line triple lumen  ENDOCRINE A:   Not active   P:   - BG <180  NEUROLOGIC A:   Sedated on propofol P:   RASS goal: 0 - Propofol drip. - Fentanyl drip  The patient is critically ill with multiple organ systems failure and requires high complexity decision making for assessment and support, frequent evaluation and titration of therapies, application of advanced monitoring technologies and extensive interpretation of multiple databases.   Critical Care Time devoted to patient care services described in this note is  45  Minutes. This time reflects time of care of this signee Dr Koren Bound. This critical care time does not reflect procedure time, or teaching time or supervisory time of PA/NP/Med student/Med Resident etc but could involve care discussion time.  Gabriella Ramos, M.D. [facility] Pulmonary/Critical Care Medicine. Pager: [phone]. After hours pager: [phone].

## 2016-12-15 NOTE — Procedures (Signed)
INTUBATION PROCEDURE NOTE  Indication: Increased WOB, hypoxia Consent: Verbal/emergent Time Out: yes Medications: Propofol, versed, rocuronium Paralytic/RSI: delayed paralysis Technique: DL MAC 3 with Grade 2 view Blade: MAC Cords Visualized: yes View: Grad 2 # of attempts: 2 Tube confirmation:   Chest rise: yes  Bilateral Breath Sounds: yes  Color change on CO2 detector: yes  ETCO2: no  CXR: Right mainstem - successfully repositioned  Successful placement: yes  Given the report we received of a GI bleed we elected to not use RSI.  Patient was sedated and a bougie was passed with the first attempt and it was seen passing through the cords and tracheal rings were felt.  ETT tube was attempted to be passed but Vocal folds spasmed.  We pushed Rocuronium but unable to pass tube.  The patient's saturations during this time dropped into the 40's (with a moderate to poor wave form) and sustained for less than 60 seconds.  The bougie was removed, attempts to bag were unsuccessful and the patient was successfully intubated on the second attempt with DL using a MAC 3 blade and passage of the ETT with standard stylet.  The patients saturations after intubation promptly returned to 100% and remained thereafter.     Meribeth Mattes, DO., [place]

## 2016-12-15 NOTE — Procedures (Signed)
Bronchoscopy Procedure Note Gabriella Ramos [id number] [birth year]  Procedure: Bronchoscopy Indications: Diagnostic evaluation of the airways, Obtain specimens for culture and/or other diagnostic studies and Remove secretions  Procedure Details Consent: Risks of procedure as well as the alternatives and risks of each were explained to the (patient/caregiver).  Consent for procedure obtained. Time Out: Verified patient identification, verified procedure, site/side was marked, verified correct patient position, special equipment/implants available, medications/allergies/relevent history reviewed, required imaging and test results available.  Performed  In preparation for procedure, patient was given 100% FiO2 and bronchoscope lubricated. Sedation: Propofol and fentanyl  Airway entered and the following bronchi were examined: RUL, RML, RLL, LUL, LLL and Bronchi.   Procedures performed: Brushings performed Bronchoscope removed.  , Patient placed back on 100% FiO2 at conclusion of procedure.    Evaluation Hemodynamic Status: BP stable throughout; O2 sats: stable throughout Patient's Current Condition: stable Specimens:  Sent purulent fluid Complications: No apparent complications Patient did tolerate procedure well.   Koren BoundYACOUB,Amethyst Gainer 12/15/2016

## 2016-12-15 NOTE — Consult Note (Signed)
[facility] for Infectious Disease    Date of Admission:  12/14/2016   Total days of antibiotics 20        Day 1 vancomycin        Day 1 piperacillin tazobactam              Reason for Consult: Acute hypoxic respiratory failure in the setting of MSSA tricuspid valve endocarditis and septic pulmonary emboli    Referring Physician: Dr. Ellin Goodie  Principal Problem:   Staphylococcus aureus bacteremia with sepsis [facility]) Active Problems:   Endocarditis of tricuspid valve   Septic arthritis of knee, left (HCC)   Sacroiliitis (HCC)   Acute pulmonary embolism (HCC)   Acute hypoxemic respiratory failure (HCC)   Septic pulmonary embolism (HCC)   IVDU (intravenous drug user)   Tobacco abuse   Depression with anxiety   Normocytic anemia   Polysubstance abuse   Pain syndrome, chronic   GERD (gastroesophageal reflux disease)   Long term current use of opiate analgesic   Hepatitis C antibody test positive   Cigarette smoker   . chlorhexidine gluconate (MEDLINE KIT)  15 mL Mouth Rinse BID  . Chlorhexidine Gluconate Cloth  6 each Topical Daily  . iopamidol      . mouth rinse  15 mL Mouth Rinse QID  . mouth rinse  15 mL Mouth Rinse 10 times per day  . pantoprazole (PROTONIX) IV  40 mg Intravenous Q12H  . piperacillin-tazobactam (ZOSYN)  IV  3.375 g Intravenous Q8H  . sodium chloride flush  10-40 mL Intracatheter Q12H  . vancomycin  1,000 mg Intravenous Q8H    Recommendations: 1. Continue current antibiotics pending BAL Gram stain and cultures  2. Hepatitis C viral load 3. RPR, GC and Chlamydia screens  Assessment: Ms. Wheeling has severe MSSA tricuspid valve endocarditis complicated by a left knee septic arthritis, left sacroiliitis and septic pulmonary emboli. I suspect that her acute respiratory failure occurred is the result of further embolization of her tricuspid valve vegetation. No vegetation was seen on today's repeat TTE. Of course, she is also at risk for  superimposed HCAP with more resistant organisms. I will continue current antibiotic therapy for now pending results of the BAL Gram stain and cultures.   HPI: Gabriella Ramos is a 41 y.o. female with history of injecting drug use who was admitted to [facility] 3 weeks ago with MSSA bacteremia and tricuspid valve endocarditis complicated by septic pulmonary emboli. She was treated with IV cefazolin and transferred to [facility]. She developed worsening left knee pain and swelling. An aspirate there grew MSSA. She was transferred here for orthopedic evaluation and underwent open drainage on 12/11/2016. She was transferred back to [facility] on 12/13/2016 but developed respiratory distress and hemoptysis and was transferred back last night. She was intubated emergently and underwent bronchoscopy.  Review of Systems: Review of Systems  Unable to perform ROS: Intubated  Constitutional: Negative for chills, diaphoresis, fever, malaise/fatigue and weight loss.  HENT: Negative for sore throat.   Respiratory: Negative for cough, sputum production and shortness of breath.   Cardiovascular: Negative for chest pain.  Gastrointestinal: Negative for abdominal pain, diarrhea, heartburn, nausea and vomiting.  Genitourinary: Negative for dysuria and frequency.  Musculoskeletal: Negative for joint pain and myalgias.  Skin: Negative for rash.  Neurological: Negative for dizziness and headaches.  Psychiatric/Behavioral: Negative for depression and substance abuse. The patient is not nervous/anxious.     Past  Medical History:  Diagnosis Date  . Chronic pain syndrome   . Depression with anxiety   . IVDU (intravenous drug user)   . Tobacco abuse     Social History  Substance Use Topics  . Smoking status: Not on file  . Smokeless tobacco: Not on file  . Alcohol use Not on file    Family History  Problem Relation Age of Onset  . Alcoholism Mother    Allergies  Allergen  Reactions  . Ketorolac Tromethamine Shortness Of Breath  . Sulfa Antibiotics     OBJECTIVE: Blood pressure (!) 89/67, pulse 81, temperature 97.7 F (36.5 C), temperature source Oral, resp. rate 18, height _0  (1.626 m), weight 200 lb 2.8 oz (90.8 kg), SpO2 98 %.  Physical Exam  Constitutional:  She is sedated on the ventilator.  Cardiovascular: Normal rate and regular rhythm.   No murmur heard. Pulmonary/Chest: Effort normal and breath sounds normal. She has no wheezes. She has no rales.  Abdominal: Soft.  Musculoskeletal:  Ace wrap on left leg.  Skin: No rash noted.  Healed incision on right forearm. Healed scar on right foot instep. A few tattoos.    Lab Results Lab Results  Component Value Date   WBC 8.0 12/15/2016   HGB 8.1 (L) 12/15/2016   HCT 25.9 (L) 12/15/2016   MCV 86.9 12/15/2016   PLT 280 12/15/2016    Lab Results  Component Value Date   CREATININE 0.99 12/14/2016   BUN 8 12/14/2016   NA 139 12/14/2016   K 4.8 12/14/2016   CL 106 12/14/2016   CO2 22 12/14/2016    Lab Results  Component Value Date   ALT <5 (L) 12/12/2016   AST 10 (L) 12/12/2016   ALKPHOS 105 12/12/2016   BILITOT 0.2 (L) 12/12/2016     Microbiology: Recent Results (from the past 240 hour(s))  Urine culture     Status: Abnormal   Collection Time: 12/10/16 10:21 PM  Result Value Ref Range Status   Specimen Description URINE, CLEAN CATCH  Final   Special Requests NONE  Final   Culture <10,000 COLONIES/mL INSIGNIFICANT GROWTH (A)  Final   Report Status 12/14/2016 FINAL  Final  Culture, blood (Routine X 2) w Reflex to ID Panel     Status: None (Preliminary result)   Collection Time: 12/11/16 12:23 AM  Result Value Ref Range Status   Specimen Description BLOOD LEFT ANTECUBITAL  Final   Special Requests IN PEDIATRIC BOTTLE 2CC  Final   Culture NO GROWTH 3 DAYS  Final   Report Status PENDING  Incomplete  Culture, blood (Routine X 2) w Reflex to ID Panel     Status: None (Preliminary  result)   Collection Time: 12/11/16 12:23 AM  Result Value Ref Range Status   Specimen Description BLOOD LEFT HAND  Final   Special Requests IN PEDIATRIC BOTTLE 2CC  Final   Culture NO GROWTH 3 DAYS  Final   Report Status PENDING  Incomplete  Surgical PCR screen     Status: None   Collection Time: 12/11/16  5:13 PM  Result Value Ref Range Status   MRSA, PCR NEGATIVE NEGATIVE Final   Staphylococcus aureus NEGATIVE NEGATIVE Final    Comment:        The Xpert SA Assay (FDA approved for NASAL specimens in patients over 7 years of age), is one component of a comprehensive surveillance program.  Test performance has been validated by [facility] for patients greater than or equal  to 58 year old. It is not intended to diagnose infection nor to guide or monitor treatment.   Aerobic/Anaerobic Culture (surgical/deep wound)     Status: None (Preliminary result)   Collection Time: 12/11/16  7:12 PM  Result Value Ref Range Status   Specimen Description TISSUE LEFT KNEE  Final   Special Requests SYNOVIAL  Final   Gram Stain   Final    ABUNDANT WBC PRESENT,BOTH PMN AND MONONUCLEAR NO ORGANISMS SEEN    Culture NO GROWTH 3 DAYS  Final   Report Status PENDING  Incomplete  MRSA PCR Screening     Status: None   Collection Time: 12/14/16 10:47 PM  Result Value Ref Range Status   MRSA by PCR NEGATIVE NEGATIVE Final    Comment:        The GeneXpert MRSA Assay (FDA approved for NASAL specimens only), is one component of a comprehensive MRSA colonization surveillance program. It is not intended to diagnose MRSA infection nor to guide or monitor treatment for MRSA infections.     Michel Bickers, MD [facility] for [place] [facility] [phone] pager   [phone] cell 12/15/2016, 2:03 PM

## 2016-12-15 NOTE — Progress Notes (Signed)
eLink Physician-Brief Progress Note Patient Name: Gabriella LeachRachel Lynn Cooperman DOB: [birth year] MRN: [id number]   Date of Service  12/15/2016  HPI/Events of Note  Patient oliguric with Foley catheter in place.   eICU Interventions  1. Nurse to do bladder scan at bedside 2. Checking portable renal ultrasound       Intervention Category Intermediate Interventions: Oliguria - evaluation and management  Lawanda CousinsJennings Allyn Bertoni 12/15/2016, 6:31 PM

## 2016-12-15 NOTE — Progress Notes (Signed)
Patient arrived to room 4N14 via [facility] transport. Nurse had not received report on this patient from [facility]. Patient was A/O X 4, on a non-rebreather mask and in obvious distress. Patient had NS @ 975ml/hr and Heparin infusing. [facility] transporter reported patient had been on a non-rebreather mask all day, had a CT scan that confirmed bilat PE's, was on a Heparin drip at [facility] and had coughed up 2 large bright red blood clots that they had personally seen (one earlier in the day when they went to get her but didn't transport her d/t assigned to a tele bed and then this evening when transporting her). Patient was connected to the cardiac monitor and her HR was 140's. [facility] team paged twice with no response. Nurse asked secretary to call. Dr. Montez Moritaarter called back and said that I needed to page the flow person and they would assign it to whoever was next to admit the patient. Reported to her we needed someone to come immediately, that the patient was having trouble breathing, HR 140's, BP elevated, and she may need intubated. She said Dr. Youlanda RoysNui was on his way. Charge nurse was already in the room and on phone with critical care. Immediately after critical care arrived patient was intubated successfully.  2350:After intubation patient was moved to room 20 in the ICU. Prior to being intubated assigned nurse asked patient if there was anybody she wanted us to call and she denied. All personal effects were transferred to room 20 with her.  0000: Bedside report given to Meridian StationJody, RN

## 2016-12-15 NOTE — Progress Notes (Signed)
ANTICOAGULATION CONSULT NOTE - Follow Up Consult  Pharmacy Consult for heparin Indication: pulmonary embolus  Allergies  Allergen Reactions  . Ketorolac Tromethamine Shortness Of Breath  . Sulfa Antibiotics     Patient Measurements: Height: _0  (162.6 cm) Weight: 200 lb 2.8 oz (90.8 kg) IBW/kg (Calculated) : 54.7 Heparin Dosing Weight: 75kg  Vital Signs: Temp: 97.7 F (36.5 C) (02/11 1142) Temp Source: Oral (02/11 1142) BP: 89/67 (02/11 1142) Pulse Rate: 81 (02/11 1142)  Labs:  Recent Labs  12/13/16 0426 12/14/16 2258 12/15/16 0922  HGB 8.7* 10.6* 8.1*  HCT 27.8* 33.6* 25.9*  PLT 319 463* 280  APTT  --  89*  --   LABPROT  --  16.7*  --   INR  --  1.34  --   CREATININE 0.86 0.99  --   TROPONINI  --  0.03* <0.03    Estimated Creatinine Clearance: 84 mL/min (by C-G formula based on SCr of 0.99 mg/dL).   Medical History: Past Medical History:  Diagnosis Date  . Chronic pain syndrome   . Depression with anxiety   . IVDU (intravenous drug user)   . Tobacco abuse     Medications:  Prescriptions Prior to Admission  Medication Sig Dispense Refill Last Dose  . ALPRAZolam (XANAX) 1 MG tablet Take 1 mg by mouth 3 (three) times daily.  5 12/10/2016 at Unknown time  . carisoprodol (SOMA) 350 MG tablet Take 350 mg by mouth 3 (three) times daily.  5 12/10/2016 at Unknown time  . diphenhydrAMINE (BENADRYL) 25 mg capsule Take 25 mg by mouth every 6 (six) hours as needed for itching.   Past Week at Unknown time  . ibuprofen (ADVIL,MOTRIN) 200 MG tablet Take 200 mg by mouth every 6 (six) hours as needed for moderate pain.   Past Week at Unknown time  . Menthol, Topical Analgesic, (ICY HOT EX) Apply 1 application topically daily as needed (pain).   Past Week at Unknown time  . oxymorphone (OPANA ER) 20 MG 12 hr tablet Take 20 mg by mouth every 12 (twelve) hours.   12/10/2016 at Unknown time   Scheduled:  . chlorhexidine gluconate (MEDLINE KIT)  15 mL Mouth Rinse BID  .  Chlorhexidine Gluconate Cloth  6 each Topical Daily  . iopamidol      . mouth rinse  15 mL Mouth Rinse QID  . mouth rinse  15 mL Mouth Rinse 10 times per day  . pantoprazole (PROTONIX) IV  40 mg Intravenous Q12H  . piperacillin-tazobactam (ZOSYN)  IV  3.375 g Intravenous Q8H  . sodium chloride flush  10-40 mL Intracatheter Q12H  . vancomycin  1,000 mg Intravenous Q8H   Infusions:  . sodium chloride 30 mL/hr at 12/15/16 0145  . fentaNYL infusion INTRAVENOUS 100 mcg/hr (12/15/16 1108)  . heparin 1,100 Units/hr (12/15/16 0441)  . propofol (DIPRIVAN) infusion 15 mcg/kg/min (12/15/16 1409)    Assessment: 40yo female was recently at [facility] for endocarditis/bacteremia >> tx'd to [facility] >> began having CP/SOB and PE found on CT complicated by Hgb down to 6, now back at [facility] ICU, [facility] reports scant hemoptysis, no clear findings indicating GIB by CCM, Hgb now 10.6, at this point benefit > risk for anticoagulation though spoke w/ Dr Diona Fanti who recommends lower goal for now and if no signs of bleeding to increase goal heparin levels. Heparin drip started 1100 uts/hr but then turned off for central line placement  Restart and draw HL 6hr later.    Goal  of Therapy:  Heparin level 0.3-0.5 units/ml (will monitor w/ CCM to determine when to aim for higher goal) Monitor platelets by anticoagulation protocol: Yes   Plan:  Restart heparin gtt at 1100 units/hr  HL 6hr after restart and daily  Bonnita Nasuti Pharm.D. CPP, BCPS Clinical Pharmacist [phone] 12/15/2016 2:42 PM

## 2016-12-15 NOTE — Progress Notes (Signed)
ANTICOAGULATION CONSULT NOTE - Initial Consult  Pharmacy Consult for heparin Indication: pulmonary embolus  Allergies  Allergen Reactions  . Ketorolac Tromethamine Shortness Of Breath  . Sulfa Antibiotics     Patient Measurements: Height: 5' 4"  (162.6 cm) Weight: 200 lb 2.8 oz (90.8 kg) IBW/kg (Calculated) : 54.7 Heparin Dosing Weight: 75kg  Vital Signs: Temp: 97.7 F (36.5 C) (02/11 0359) Temp Source: Oral (02/11 0359) BP: 110/82 (02/11 0359) Pulse Rate: 94 (02/11 0359)  Labs:  Recent Labs  12/12/16 0424 12/13/16 0426 12/14/16 2258  HGB 8.0* 8.7* 10.6*  HCT 25.4* 27.8* 33.6*  PLT 312 319 463*  APTT  --   --  89*  LABPROT  --   --  16.7*  INR  --   --  1.34  CREATININE 0.87 0.86 0.99  TROPONINI  --   --  0.03*    Estimated Creatinine Clearance: 84 mL/min (by C-G formula based on SCr of 0.99 mg/dL).   Medical History: Past Medical History:  Diagnosis Date  . Chronic pain syndrome   . Depression with anxiety   . IVDU (intravenous drug user)   . Tobacco abuse     Medications:  Prescriptions Prior to Admission  Medication Sig Dispense Refill Last Dose  . ALPRAZolam (XANAX) 1 MG tablet Take 1 mg by mouth 3 (three) times daily.  5 12/10/2016 at Unknown time  . carisoprodol (SOMA) 350 MG tablet Take 350 mg by mouth 3 (three) times daily.  5 12/10/2016 at Unknown time  . diphenhydrAMINE (BENADRYL) 25 mg capsule Take 25 mg by mouth every 6 (six) hours as needed for itching.   Past Week at Unknown time  . ibuprofen (ADVIL,MOTRIN) 200 MG tablet Take 200 mg by mouth every 6 (six) hours as needed for moderate pain.   Past Week at Unknown time  . Menthol, Topical Analgesic, (ICY HOT EX) Apply 1 application topically daily as needed (pain).   Past Week at Unknown time  . oxymorphone (OPANA ER) 20 MG 12 hr tablet Take 20 mg by mouth every 12 (twelve) hours.   12/10/2016 at Unknown time   Scheduled:  . chlorhexidine gluconate (MEDLINE KIT)  15 mL Mouth Rinse BID  .  Chlorhexidine Gluconate Cloth  6 each Topical Daily  . mouth rinse  15 mL Mouth Rinse QID  . pantoprazole (PROTONIX) IV  40 mg Intravenous Q12H  . piperacillin-tazobactam  3.375 g Intravenous Once  . piperacillin-tazobactam (ZOSYN)  IV  3.375 g Intravenous Q8H  . sodium chloride flush  10-40 mL Intracatheter Q12H  . vancomycin  1,000 mg Intravenous Once  . vancomycin  1,000 mg Intravenous Q8H   Infusions:  . sodium chloride 30 mL/hr at 12/15/16 0145  . propofol (DIPRIVAN) infusion 30 mcg/kg/min (12/15/16 0345)    Assessment: 41yo female was recently at [facility] for endocarditis/bacteremia >> tx'd to [facility] >> began having CP/SOB and PE found on CT complicated by Hgb down to 6, now back at [facility] ICU, [facility] reports scant hemoptysis, no clear findings indicating GIB by CCM, Hgb now 10.6, at this point benefit > risk for anticoagulation though spoke w/ Dr Diona Fanti who recommends lower goal for now and if no signs of bleeding to increase goal heparin levels.  Goal of Therapy:  Heparin level 0.3-0.5 units/ml (will monitor w/ CCM to determine when to aim for higher goal) Monitor platelets by anticoagulation protocol: Yes   Plan:  Will begin heparin gtt at 1100 units/hr and monitor heparin levels and CBC.  Wynona Neat, PharmD, BCPS  12/15/2016,4:15 AM

## 2016-12-16 ENCOUNTER — Inpatient Hospital Stay (HOSPITAL_COMMUNITY): Payer: Medicaid Other

## 2016-12-16 DIAGNOSIS — J9690 Respiratory failure, unspecified, unspecified whether with hypoxia or hypercapnia: Secondary | ICD-10-CM

## 2016-12-16 DIAGNOSIS — R768 Other specified abnormal immunological findings in serum: Secondary | ICD-10-CM

## 2016-12-16 DIAGNOSIS — R609 Edema, unspecified: Secondary | ICD-10-CM

## 2016-12-16 DIAGNOSIS — A4101 Sepsis due to Methicillin susceptible Staphylococcus aureus: Principal | ICD-10-CM

## 2016-12-16 DIAGNOSIS — Z01818 Encounter for other preprocedural examination: Secondary | ICD-10-CM

## 2016-12-16 DIAGNOSIS — Z4659 Encounter for fitting and adjustment of other gastrointestinal appliance and device: Secondary | ICD-10-CM

## 2016-12-16 DIAGNOSIS — J90 Pleural effusion, not elsewhere classified: Secondary | ICD-10-CM

## 2016-12-16 LAB — BASIC METABOLIC PANEL
ANION GAP: 7 (ref 5–15)
BUN: 14 mg/dL (ref 6–20)
CALCIUM: 8 mg/dL — AB (ref 8.9–10.3)
CO2: 25 mmol/L (ref 22–32)
Chloride: 107 mmol/L (ref 101–111)
Creatinine, Ser: 1.09 mg/dL — ABNORMAL HIGH (ref 0.44–1.00)
GFR calc Af Amer: >60 mL/min (ref >60)
GFR calc non Af Amer: >60 mL/min (ref >60)
GLUCOSE: 87 mg/dL (ref 65–99)
Potassium: 4.4 mmol/L (ref 3.5–5.1)
Sodium: 139 mmol/L (ref 135–145)

## 2016-12-16 LAB — CBC
HEMATOCRIT: 25.4 % — AB (ref 36.0–46.0)
HEMOGLOBIN: 7.9 g/dL — AB (ref 12.0–15.0)
MCH: 27.2 pg (ref 26.0–34.0)
MCHC: 31.1 g/dL (ref 30.0–36.0)
MCV: 87.6 fL (ref 78.0–100.0)
Platelets: 251 10*3/uL (ref 150–400)
RBC: 2.9 MIL/uL — AB (ref 3.87–5.11)
RDW: 15.5 % (ref 11.5–15.5)
WBC: 7.8 10*3/uL (ref 4.0–10.5)

## 2016-12-16 LAB — AEROBIC/ANAEROBIC CULTURE (SURGICAL/DEEP WOUND)

## 2016-12-16 LAB — CULTURE, BLOOD (ROUTINE X 2)
CULTURE: NO GROWTH
Culture: NO GROWTH

## 2016-12-16 LAB — GLUCOSE, CAPILLARY
GLUCOSE-CAPILLARY: 98 mg/dL (ref 65–99)
Glucose-Capillary: 88 mg/dL (ref 65–99)

## 2016-12-16 LAB — AEROBIC/ANAEROBIC CULTURE W GRAM STAIN (SURGICAL/DEEP WOUND): Culture: NO GROWTH

## 2016-12-16 LAB — BLOOD GAS, ARTERIAL
Acid-base deficit: 1.8 mmol/L (ref 0.0–2.0)
Bicarbonate: 23.8 mmol/L (ref 20.0–28.0)
DRAWN BY: 419771
FIO2: 40
O2 SAT: 96.8 %
PATIENT TEMPERATURE: 98.6
PCO2 ART: 49.9 mmHg — AB (ref 32.0–48.0)
PEEP: 5 cmH2O
PH ART: 7.299 — AB (ref 7.350–7.450)
RATE: 18 resp/min
VT: 440 mL
pO2, Arterial: 96.7 mmHg (ref 83.0–108.0)

## 2016-12-16 LAB — PHOSPHORUS: Phosphorus: 4.5 mg/dL (ref 2.5–4.6)

## 2016-12-16 LAB — PROTIME-INR
INR: 1.21
PROTHROMBIN TIME: 15.4 s — AB (ref 11.4–15.2)

## 2016-12-16 LAB — HCV RNA QUANT RFLX ULTRA OR GENOTYP
HCV RNA QNT(LOG COPY/ML): UNDETERMINED {Log_IU}/mL
HepC Qn: NOT DETECTED IU/mL

## 2016-12-16 LAB — RPR: RPR Ser Ql: NONREACTIVE

## 2016-12-16 LAB — HEPARIN LEVEL (UNFRACTIONATED): Heparin Unfractionated: <0.1 IU/mL — ABNORMAL LOW (ref 0.30–0.70)

## 2016-12-16 LAB — MAGNESIUM: Magnesium: 1.7 mg/dL (ref 1.7–2.4)

## 2016-12-16 MED ORDER — DEXTROSE 5 % IV SOLN
2.0000 g | INTRAVENOUS | Status: DC
  Administered 2016-12-16 – 2016-12-17 (×2): 2 g via INTRAVENOUS
  Filled 2016-12-16 (×3): qty 2

## 2016-12-16 MED ORDER — CHLORHEXIDINE GLUCONATE CLOTH 2 % EX PADS: 6.0000 | MEDICATED_PAD | Freq: Every day | CUTANEOUS | Status: DC

## 2016-12-16 MED ORDER — PRO-STAT SUGAR FREE PO LIQD
60.0000 mL | Freq: Three times a day (TID) | ORAL | Status: DC
Start: 2016-12-16 — End: 2016-12-17
  Administered 2016-12-16 (×2): 60 mL
  Filled 2016-12-16 (×2): qty 60

## 2016-12-16 MED ORDER — HEPARIN BOLUS VIA INFUSION
2000.0000 [IU] | Freq: Once | INTRAVENOUS | Status: AC
  Administered 2016-12-16: 2000 [IU] via INTRAVENOUS
  Filled 2016-12-16: qty 2000

## 2016-12-16 MED ORDER — ADULT MULTIVITAMIN LIQUID CH
15.0000 mL | Freq: Every day | ORAL | Status: DC
  Administered 2016-12-16: 15 mL
  Filled 2016-12-16 (×3): qty 15

## 2016-12-16 MED ORDER — FUROSEMIDE 10 MG/ML IJ SOLN
40.0000 mg | Freq: Two times a day (BID) | INTRAMUSCULAR | Status: AC
  Administered 2016-12-16 – 2016-12-17 (×4): 40 mg via INTRAVENOUS
  Filled 2016-12-16 (×4): qty 4

## 2016-12-16 MED ORDER — VITAL HIGH PROTEIN PO LIQD
1000.0000 mL | ORAL | Status: DC
  Administered 2016-12-16: 1000 mL

## 2016-12-16 MED ORDER — HEPARIN SODIUM (PORCINE) 5000 UNIT/ML IJ SOLN
5000.0000 [IU] | Freq: Three times a day (TID) | INTRAMUSCULAR | Status: DC
  Administered 2016-12-16 – 2016-12-18 (×7): 5000 [IU] via SUBCUTANEOUS
  Filled 2016-12-16 (×7): qty 1

## 2016-12-16 NOTE — Care Management Note (Signed)
Case Management Note  Patient Details  Name: Gabriella MendsRachel Lynn Ramos MRN: [id number] Date of Birth: 08/04/1976  Subjective/Objective:      Adm w sepis and knee pain              Action/Plan: from [facility]. Alerted rep loury of adm.   Expected Discharge Date:                  Expected Discharge Plan:  Long Term Acute Care (LTAC)  In-House Referral:     Discharge planning Services  CM Consult  Post Acute Care Choice:    Choice offered to:     DME Arranged:    DME Agency:     HH Arranged:    HH Agency:     Status of Service:  In process, will continue to follow  If discussed at Long Length of Stay Meetings, dates discussed:    Additional Comments: sw for poly subst abuse. Hanley Haysowell, Genessa Beman T, RN 12/16/2016, 11:09 AM

## 2016-12-16 NOTE — Progress Notes (Addendum)
PULMONARY / CRITICAL CARE MEDICINE   Name: Gabriella Ramos MRN: [id number] DOB: 01/24/1976    ADMISSION DATE:  12/14/2016   CHIEF COMPLAINT:  Left knee pain  HISTORY OF PRESENT ILLNESS:   41 y.o. female with medical history significant of polysubstance abuse, tobacco abuse, anemia, IVDU, chronic pain syndrome, depression, anxiety, GERD, constipation, who presents with left knee pain and swelling.  Patient was recently admitted to [facility] on 11/25/16 due to right lower lobe pneumonia with multiple bilateral foci. She was found to have acute endocarditis with a fragile large tricuspid vegetation (MSSA on Bcx) that it was 2 cm in size by 2-D echo. She was [facility] to [facility] for continuation of IV Abx of Cefazoline for total of 8 weeks with a stop date of 01/13/17. While in [facility], patient complains of left kidney swelling and pain. Left knee joint aspiration was done, and culture of synovial fluid grew out methicillin-sensitive staph aureus. Ortho was consulted in [facility] hospital and recommended knee washout. Per report, pt has anemia, hemolytic anemia workup tests including indirect bilirubin, LDH, haptoglobin and peripheral smear were sent out, the results are pending. Pt was transfused with one units of blood on 12/09/16. Her Hgb was 7.9 on 12/10/16. Pt had negative left LE venous doppler for DVT on 12/05/16.  Per the [facility] transfer note (in patients chart), on 12/14/2016 she reported pleuritic chest pain and SOB.  CT PE demonstrated Right pulmonary embolism.  Several days ago at [facility] she was noted to have a hemoglobin drop to 6 where she received 2U PRBCs.  No obvious sources of bleeding were found and hemolysis labs negative.  She arrived on the floor and was found to be in acute distress with severe work of breathing and 1 word dyspnea, we elected to intubate immediately.  SUBJECTIVE: awake, no distress, ph last low, no pressors, no bleeding  VITAL  SIGNS: Temp:  [97.7 F (36.5 C)-98.8 F (37.1 C)] 98.8 F (37.1 C) (02/12 0400) Pulse Rate:  [74-93] 90 (02/12 0500) Resp:  [0-21] 16 (02/12 0500) BP: (84-110)/(61-81) 107/81 (02/12 0500) SpO2:  [98 %-100 %] 100 % (02/12 0500) FiO2 (%):  [40 %] 40 % (02/12 0600) Weight:  [93.9 kg (207 lb 0.2 oz)] 93.9 kg (207 lb 0.2 oz) (02/12 0600)   HEMODYNAMICS: CVP:  [13 mmHg-23 mmHg] 16 mmHg VENTILATOR SETTINGS: Vent Mode: PRVC FiO2 (%):  [40 %] 40 % Set Rate:  [18 bmp] 18 bmp Vt Set:  [440 mL] 440 mL PEEP:  [5 cmH20] 5 cmH20 Plateau Pressure:  [19 cmH20-28 cmH20] 19 cmH20 INTAKE / OUTPUT:  Intake/Output Summary (Last 24 hours) at 12/16/16 0705 Last data filed at 12/16/16 0600  Gross per 24 hour  Intake          2242.35 ml  Output              235 ml  Net          2007.35 ml   PHYSICAL EXAMINATION: General:  Acutely ill young female, NAD on propofol / fentanyl Neuro:  Awake, NONFOCAL, appropriate HEENT:  jvd up Cardiovascular:  RRR, Nl S1/S2 Lungs:  Coarse BS diffusely, reduced bases Abdomen:  Soft, NT, ND and +BS Skin:  Intact Ext: no toe nails  LABS:  CBC  Recent Labs Lab 12/15/16 1639 12/15/16 2018 12/16/16 0436  WBC 7.2  7.3 6.0 7.8  HGB 7.9*  7.8* 7.5* 7.9*  HCT 25.5*  25.1* 24.2* 25.4*  PLT 239  238  231 251   Coag's  Recent Labs Lab 12/11/16 0023 12/14/16 2258 12/16/16 0554  APTT 52* 89*  --   INR 1.28 1.34 1.21   BMET  Recent Labs Lab 12/12/16 0424 12/13/16 0426 12/14/16 2258  NA 137 136 139  K 4.5 4.2 4.8  CL 107 105 106  CO2 22 21* 22  BUN 11 9 8   CREATININE 0.87 0.86 0.99  GLUCOSE 91 95 126*   Electrolytes  Recent Labs Lab 12/12/16 0424 12/13/16 0426 12/14/16 2258  CALCIUM 7.9* 8.0* 8.4*   Sepsis Markers  Recent Labs Lab 12/11/16 0023 12/14/16 2330  LATICACIDVEN 0.7 1.6   ABG  Recent Labs Lab 12/14/16 2255 12/15/16 0512 12/16/16 0452  PHART 7.256* 7.309* 7.299*  PCO2ART 51.9* 49.0* 49.9*  PO2ART 293.0* 183.0*  96.7   Liver Enzymes  Recent Labs Lab 12/12/16 0424  AST 10*  ALT <5*  ALKPHOS 105  BILITOT 0.2*  ALBUMIN 1.5*   Cardiac Enzymes  Recent Labs Lab 12/14/16 2258 12/15/16 0922 12/15/16 1639  TROPONINI 0.03* <0.03 <0.03   Glucose  Recent Labs Lab 12/11/16 1125 12/12/16 0804 12/13/16 0748 12/15/16 0031 12/15/16 0409  GLUCAP 85 99 97 114* 116*   Imaging Ct Angio Chest Pe W Or Wo Contrast  Result Date: 12/15/2016 CLINICAL DATA:  Shortness of Breath EXAM: CT ANGIOGRAPHY CHEST WITH CONTRAST TECHNIQUE: Multidetector CT imaging of the chest was performed using the standard protocol during bolus administration of intravenous contrast. Multiplanar CT image reconstructions and MIPs were obtained to evaluate the vascular anatomy. CONTRAST:  100 mL Isovue 370 COMPARISON:  11/26/2016 FINDINGS: Cardiovascular: Thoracic aorta is within normal limits without aneurysmal dilatation or dissection. Heavy coronary calcifications are seen. The pulmonary artery is well visualized without definitive filling defects to suggest pulmonary embolism. Mediastinum/Nodes: Thoracic inlet is within normal limits. An endotracheal tube and nasogastric catheter are noted in satisfactory position. Scattered mediastinal lymph nodes are noted. Prevascular node is seen on image number 29 of series 6 measuring 1.4 cm in short axis. Right peritracheal node is noted measuring 1.7 cm in short axis. Scattered small hilar nodes are noted as well. Lungs/Pleura: Bilateral pleural effusions are noted right greater than left. Diffuse bilateral consolidation is seen which has increased significantly in the interval from the prior exam. Upper Abdomen: Within normal limits. Musculoskeletal: No acute bony abnormality is seen. Review of the MIP images confirms the above findings. IMPRESSION: New patchy bilateral infiltrate with associated large pleural effusions. No evidence of pulmonary emboli. Scattered mediastinal and hilar lymph nodes  which have increased when compared with the prior exam likely of reactive nature. Electronically Signed   By: Alcide Clever M.D.   On: 12/15/2016 17:38   US Renal Port  Result Date: 12/15/2016 CLINICAL DATA:  Decreased urine output.  UTI. EXAM: RENAL / URINARY TRACT ULTRASOUND COMPLETE COMPARISON:  None. FINDINGS: Right Kidney: Length: 13.2 cm. Increased cortical echogenicity and cortical thinning. Left Kidney: Length: 14 cm. Increased cortical echogenicity and cortical thinning. Bladder: The bladder is decompressed with a Foley catheter. Small left pleural effusion, large right pleural effusion, and mild ascites. IMPRESSION: 1. Medical renal disease.  No hydronephrosis. 2. Mild ascites. Bilateral pleural effusions, right greater than left. Electronically Signed   By: Gerome Sam III M.D   On: 12/15/2016 20:32   Dg Chest Port 1 View  Result Date: 12/15/2016 CLINICAL DATA:  Central line placement.  Respiratory failure. EXAM: PORTABLE CHEST 1 VIEW COMPARISON:  Film at 0524 hours FINDINGS: Endotracheal tube remains  with the tip approximately 3 cm above the carina. There is interval placement of a left jugular central line with the catheter tip in the SVC. No pneumothorax. Significantly improved aeration of the left lung. Stable atelectasis/airspace disease of the right lower lung. IMPRESSION: Central line tip is in the SVC. No pneumothorax. Interval improved aeration of the left lung. Electronically Signed   By: Irish Lack M.D.   On: 12/15/2016 13:50   ASSESSMENT / PLAN:  PULMONARY A: Acute hypoxemic respiratory failure Right lung white out after right mainstem intubation Submassive PE NOT noted on CT at [facility] Large effusion rt PNA ATX  P:   - Lung open remains -need neg balance, lasix -CT reviewed, no PE noted on our CT scan, this seems to be clinical circumstance of septic emboli if at all at [facility] CT, with concern bleed and treated endocarditis (relative contra indication)- dc  heparin -Ph noted, will need rate increase, No SBT with MV needs, ph noted -pcxr in am  -will likely require thoracentesis vs chest tube, will decide in am after assess response to diuresis and vent needs -requires ABX  CARDIOVASCULAR A:  Bacterial endocarditis TV vegitation Elevated troponin and BNP - 2/2 submassive PE  P:  - Blood cultures to follow - Vanc/zosyn, see ID - ID consulted - 2D echo ordered- pending  RENAL A:   Low output overnight, gross volume overload P:   - BMET in AM, awaited - lasix to neg 1-2 liters goals  GASTROINTESTINAL A:   There were reports of a GI bleed from [facility], but this was not confirmed by [facility] staff, at [facility] there was not e/o GI Bleed on assessment.  Given the submassive PE we are anticoagulating.  P:   - TF per nutrition -ppi  HEMATOLOGIC A:   Anemia of chronic disease Gi bleed concerns at [facility] PE neg at [facility] P:  - Trend CBC in am  -heparin drip dc, doppler lowers  Also Add sub q hep - Transfuse per ICU protocol, if less 7  INFECTIOUS A:   MSSA - TV bacterial endocarditis Started Cefazolin on 11/18/2016 plan was for 8 weeks with stop date 01/13/2017  MSSA - Right knee septic arthritis s/p washout  P:   BCx2 - 12/15/2016 UC 12/15/2016 Sputum 12/15/2016 Abx:  Vosyn , ID to see Limit central access as able  ENDOCRINE A:   Not active   P:   - CBg assessments  NEUROLOGIC A:   Sedated on propofol P:   RASS goal: 0 - Propofol drip. - Fentanyl drip -WUA  Ccm time 35 min   Mcarthur Rossetti. Tyson Alias, MD, FACP Pgr: [phone] [facility]

## 2016-12-16 NOTE — Progress Notes (Signed)
Preliminary results by tech - Venous Duplex Lower Ext. Completed. Negative for deep and superficial vein thrombosis in both legs.  Nashonda Limberg, BS, RDMS, RVT  

## 2016-12-16 NOTE — Progress Notes (Signed)
ANTICOAGULATION CONSULT NOTE - Follow Up Consult  Pharmacy Consult for heparin Indication: pulmonary embolus  Allergies  Allergen Reactions  . Ketorolac Tromethamine Shortness Of Breath  . Sulfa Antibiotics     Patient Measurements: Height: 5\' 4"  (162.6 cm) Weight: 207 lb 0.2 oz (93.9 kg) IBW/kg (Calculated) : 54.7 Heparin Dosing Weight: 75kg  Vital Signs: Temp: 98.8 F (37.1 C) (02/12 0400) Temp Source: Oral (02/12 0400) BP: 107/81 (02/12 0500) Pulse Rate: 90 (02/12 0500)  Labs:  Recent Labs  12/14/16 2258 12/15/16 0922 12/15/16 1639 12/15/16 2018 12/16/16 0436 12/16/16 0554  HGB 10.6* 8.1* 7.9*  7.8* 7.5* 7.9*  --   HCT 33.6* 25.9* 25.5*  25.1* 24.2* 25.4*  --   PLT 463* 280 239  238 231 251  --   APTT 89*  --   --   --   --   --   LABPROT 16.7*  --   --   --   --  15.4*  INR 1.34  --   --   --   --  1.21  HEPARINUNFRC  --   --   --  <0.10*  --  <0.10*  CREATININE 0.99  --   --   --   --   --   TROPONINI 0.03* <0.03 <0.03  --   --   --     Estimated Creatinine Clearance: 85.6 mL/min (by C-G formula based on SCr of 0.99 mg/dL).  Assessment: 41yo female [facility]ocard[facility]acte[facility] >> tx'd to [facility] >> began having CP/SOB and PE found on CT complicated by Hgb down to 6, [facility]. [facility] reports scant hemoptysis, no clear findings indicating GIB by CCM. Hgb 7.9 (low but stable over past 24 hours. Heparin level remains undetectable on gtt at 1300 units/hr. No issues with line or bleeding reported per RN.  Goal of Therapy:  Heparin level 0.3-0.5 units/ml (will monitor w/ CCM to determine when to aim for higher goal) Monitor platelets by anticoagulation protocol: Yes   Plan:  Bolus heparin 2000 units and increase heparin to 1600 units/hr  F/u 6hr heparin level  Christoper Fabianaron Krislyn Donnan, PharmD, BCPS Clinical pharmacist, pager [phone] 12/16/2016 6:53 AM

## 2016-12-16 NOTE — Progress Notes (Addendum)
Subjective: She is complaining of pain in multiple sites asking for pain meds she also had a lot of questions about risk of dying from the infection and what can be done further about her infection.   Antibiotics:  Anti-infectives    Start     Dose/Rate Route Frequency Ordered Stop   12/15/16 1000  vancomycin (VANCOCIN) IVPB 1000 mg/200 mL premix     1,000 mg 200 mL/hr over 60 Minutes Intravenous Every 8 hours 12/15/16 0408     12/15/16 1000  piperacillin-tazobactam (ZOSYN) IVPB 3.375 g     3.375 g 12.5 mL/hr over 240 Minutes Intravenous Every 8 hours 12/15/16 0408     12/15/16 0415  vancomycin (VANCOCIN) IVPB 1000 mg/200 mL premix     1,000 mg 200 mL/hr over 60 Minutes Intravenous  Once 12/15/16 0408 12/15/16 0657   12/15/16 0415  piperacillin-tazobactam (ZOSYN) IVPB 3.375 g     3.375 g 100 mL/hr over 30 Minutes Intravenous  Once 12/15/16 0408 12/15/16 0603      Medications: Scheduled Meds: . chlorhexidine gluconate (MEDLINE KIT)  15 mL Mouth Rinse BID  . Chlorhexidine Gluconate Cloth  6 each Topical Daily  . furosemide  40 mg Intravenous Q12H  . heparin subcutaneous  5,000 Units Subcutaneous Q8H  . mouth rinse  15 mL Mouth Rinse 10 times per day  . pantoprazole (PROTONIX) IV  40 mg Intravenous Q12H  . piperacillin-tazobactam (ZOSYN)  IV  3.375 g Intravenous Q8H  . sodium chloride flush  10-40 mL Intracatheter Q12H  . vancomycin  1,000 mg Intravenous Q8H   Continuous Infusions: . sodium chloride 10 mL/hr at 12/16/16 0719  . fentaNYL infusion INTRAVENOUS 250 mcg/hr (12/16/16 1009)  . propofol (DIPRIVAN) infusion 30 mcg/kg/min (12/16/16 1000)   PRN Meds:.levalbuterol, sodium chloride flush    Objective: Weight change: 6 lb 13.4 oz (3.1 kg)  Intake/Output Summary (Last 24 hours) at 12/16/16 1205 Last data filed at 12/16/16 1200  Gross per 24 hour  Intake           2528.5 ml  Output             1235 ml  Net           1293.5 ml   Blood pressure 110/71,  pulse 70, temperature 98.5 F (36.9 C), temperature source Oral, resp. rate (!) 24, height _0  (1.626 m), weight 207 lb 0.2 oz (93.9 kg), SpO2 99 %. Temp:  [98.4 F (36.9 C)-99 F (37.2 C)] 98.5 F (36.9 C) (02/12 1100) Pulse Rate:  [68-93] 70 (02/12 1200) Resp:  [0-24] 24 (02/12 1200) BP: (88-110)/(61-81) 110/71 (02/12 1200) SpO2:  [92 %-100 %] 99 % (02/12 1200) FiO2 (%):  [40 %] 40 % (02/12 1137) Weight:  [207 lb 0.2 oz (93.9 kg)] 207 lb 0.2 oz (93.9 kg) (02/12 0600)  Physical Exam: General: Alert and awake, oriented x3, on the ventilator to me dictating by writing notes HEENT: anicteric sclera, pupils reactive to light and accommodation, EOMI CVS tachycardic, normal r,  no murmur rubs or gallops Chest: Relatively clear to auscultation bilaterally anteriorly no wheezing Abdomen: soft nontender, nondistended, normal bowel sounds, Extremities: no  clubbing or edema noted bilaterally Skin: no rashes Neuro: nonfocal  CBC:  CBC Latest Ref Rng & Units 12/16/2016 12/15/2016 12/15/2016  WBC 4.0 - 10.5 K/uL 7.8 6.0 7.2  Hemoglobin 12.0 - 15.0 g/dL 7.9(L) 7.5(L) 7.9(L)  Hematocrit 36.0 - 46.0 % 25.4(L) 24.2(L) 25.5(L)  Platelets 150 -  400 K/uL 251 231 239     BMET  Recent Labs  12/14/16 2258 12/16/16 0656  NA 139 139  K 4.8 4.4  CL 106 107  CO2 22 25  GLUCOSE 126* 87  BUN 8 14  CREATININE 0.99 1.09*  CALCIUM 8.4* 8.0*     Liver Panel  No results for input(s): PROT, ALBUMIN, AST, ALT, ALKPHOS, BILITOT, BILIDIR, IBILI in the last 72 hours.     Sedimentation Rate No results for input(s): ESRSEDRATE in the last 72 hours. C-Reactive Protein No results for input(s): CRP in the last 72 hours.  Micro Results: Recent Results (from the past 720 hour(s))  Urine culture     Status: Abnormal   Collection Time: 12/10/16 10:21 PM  Result Value Ref Range Status   Specimen Description URINE, CLEAN CATCH  Final   Special Requests NONE  Final   Culture <10,000 COLONIES/mL  INSIGNIFICANT GROWTH (A)  Final   Report Status 12/14/2016 FINAL  Final  Culture, blood (Routine X 2) w Reflex to ID Panel     Status: None   Collection Time: 12/11/16 12:23 AM  Result Value Ref Range Status   Specimen Description BLOOD LEFT ANTECUBITAL  Final   Special Requests IN PEDIATRIC BOTTLE 2CC  Final   Culture NO GROWTH 5 DAYS  Final   Report Status 12/16/2016 FINAL  Final  Culture, blood (Routine X 2) w Reflex to ID Panel     Status: None   Collection Time: 12/11/16 12:23 AM  Result Value Ref Range Status   Specimen Description BLOOD LEFT HAND  Final   Special Requests IN PEDIATRIC BOTTLE 2CC  Final   Culture NO GROWTH 5 DAYS  Final   Report Status 12/16/2016 FINAL  Final  Surgical PCR screen     Status: None   Collection Time: 12/11/16  5:13 PM  Result Value Ref Range Status   MRSA, PCR NEGATIVE NEGATIVE Final   Staphylococcus aureus NEGATIVE NEGATIVE Final    Comment:        The Xpert SA Assay (FDA approved for NASAL specimens in patients over 32 years of age), is one component of a comprehensive surveillance program.  Test performance has been validated by [facility] for patients greater than or equal to 61 year old. It is not intended to diagnose infection nor to guide or monitor treatment.   Aerobic/Anaerobic Culture (surgical/deep wound)     Status: None   Collection Time: 12/11/16  7:12 PM  Result Value Ref Range Status   Specimen Description TISSUE LEFT KNEE  Final   Special Requests SYNOVIAL  Final   Gram Stain   Final    ABUNDANT WBC PRESENT,BOTH PMN AND MONONUCLEAR NO ORGANISMS SEEN    Culture No growth aerobically or anaerobically.  Final   Report Status 12/16/2016 FINAL  Final  MRSA PCR Screening     Status: None   Collection Time: 12/14/16 10:47 PM  Result Value Ref Range Status   MRSA by PCR NEGATIVE NEGATIVE Final    Comment:        The GeneXpert MRSA Assay (FDA approved for NASAL specimens only), is one component of a comprehensive MRSA  colonization surveillance program. It is not intended to diagnose MRSA infection nor to guide or monitor treatment for MRSA infections.   Culture, blood (routine x 2)     Status: None (Preliminary result)   Collection Time: 12/15/16  1:54 AM  Result Value Ref Range Status   Specimen Description BLOOD  LEFT ANTECUBITAL  Final   Special Requests BOTTLES DRAWN AEROBIC AND ANAEROBIC  5CC EA  Final   Culture NO GROWTH 1 DAY  Final   Report Status PENDING  Incomplete  Culture, blood (routine x 2)     Status: None (Preliminary result)   Collection Time: 12/15/16  1:55 AM  Result Value Ref Range Status   Specimen Description BLOOD BLOOD LEFT HAND  Final   Special Requests BOTTLES DRAWN AEROBIC ONLY  4CC  Final   Culture NO GROWTH 1 DAY  Final   Report Status PENDING  Incomplete  Culture, bal-quantitative     Status: None (Preliminary result)   Collection Time: 12/15/16 10:16 AM  Result Value Ref Range Status   Specimen Description BRONCHIAL ALVEOLAR LAVAGE  Final   Special Requests Normal  Final   Gram Stain   Final    RARE WBC PRESENT, PREDOMINANTLY PMN NO ORGANISMS SEEN    Culture CULTURE REINCUBATED FOR BETTER GROWTH  Final   Report Status PENDING  Incomplete    Studies/Results: Dg Abd 1 View  Result Date: 12/14/2016 CLINICAL DATA:  Endotracheal and NGT placements. EXAM: ABDOMEN - 1 VIEW COMPARISON:  CT abdomen and pelvis 10/28/2014 FINDINGS: Enteric tube is present. The tip is below the field of view but likely is in the distal stomach. Proximal side hole is in the body of the stomach. Incidental note of intubation of the right mainstem bronchus. See additional chest x-ray report. Bilateral pulmonary infiltrates and effusions. IMPRESSION: Enteric tube tip is off the field of view but location is consistent with distal stomach. Electronically Signed   By: Lucienne Capers M.D.   On: 12/14/2016 23:49   Ct Angio Chest Pe W Or Wo Contrast  Result Date: 12/15/2016 CLINICAL DATA:   Shortness of Breath EXAM: CT ANGIOGRAPHY CHEST WITH CONTRAST TECHNIQUE: Multidetector CT imaging of the chest was performed using the standard protocol during bolus administration of intravenous contrast. Multiplanar CT image reconstructions and MIPs were obtained to evaluate the vascular anatomy. CONTRAST:  100 mL Isovue 370 COMPARISON:  11/26/2016 FINDINGS: Cardiovascular: Thoracic aorta is within normal limits without aneurysmal dilatation or dissection. Heavy coronary calcifications are seen. The pulmonary artery is well visualized without definitive filling defects to suggest pulmonary embolism. Mediastinum/Nodes: Thoracic inlet is within normal limits. An endotracheal tube and nasogastric catheter are noted in satisfactory position. Scattered mediastinal lymph nodes are noted. Prevascular node is seen on image number 29 of series 6 measuring 1.4 cm in short axis. Right peritracheal node is noted measuring 1.7 cm in short axis. Scattered small hilar nodes are noted as well. Lungs/Pleura: Bilateral pleural effusions are noted right greater than left. Diffuse bilateral consolidation is seen which has increased significantly in the interval from the prior exam. Upper Abdomen: Within normal limits. Musculoskeletal: No acute bony abnormality is seen. Review of the MIP images confirms the above findings. IMPRESSION: New patchy bilateral infiltrate with associated large pleural effusions. No evidence of pulmonary emboli. Scattered mediastinal and hilar lymph nodes which have increased when compared with the prior exam likely of reactive nature. Electronically Signed   By: Inez Catalina M.D.   On: 12/15/2016 17:38   US Renal Port  Result Date: 12/15/2016 CLINICAL DATA:  Decreased urine output.  UTI. EXAM: RENAL / URINARY TRACT ULTRASOUND COMPLETE COMPARISON:  None. FINDINGS: Right Kidney: Length: 13.2 cm. Increased cortical echogenicity and cortical thinning. Left Kidney: Length: 14 cm. Increased cortical  echogenicity and cortical thinning. Bladder: The bladder is decompressed with a  Foley catheter. Small left pleural effusion, large right pleural effusion, and mild ascites. IMPRESSION: 1. Medical renal disease.  No hydronephrosis. 2. Mild ascites. Bilateral pleural effusions, right greater than left. Electronically Signed   By: Dorise Bullion III M.D   On: 12/15/2016 20:32   Dg Chest Port 1 View  Result Date: 12/16/2016 CLINICAL DATA:  Pulmonary embolus. EXAM: PORTABLE CHEST 1 VIEW COMPARISON:  CT 12/15/2016 .  Chest x-ray 12/15/2016. FINDINGS: Endotracheal tube, NG tube, left IJ line stable position. Cardiomegaly with bilateral infiltrates/ and bilateral pleural effusions. Interim improvement from prior exam. No pneumothorax. IMPRESSION: 1. Lines and tubes in stable position. 2. Cardiomegaly with bilateral pulmonary infiltrates/edema and bilateral pleural effusions. Interim improvement from prior exam. Electronically Signed   By: Marcello Moores  Register   On: 12/16/2016 07:26   Dg Chest Port 1 View  Result Date: 12/15/2016 CLINICAL DATA:  Central line placement.  Respiratory failure. EXAM: PORTABLE CHEST 1 VIEW COMPARISON:  Film at 0524 hours FINDINGS: Endotracheal tube remains with the tip approximately 3 cm above the carina. There is interval placement of a left jugular central line with the catheter tip in the SVC. No pneumothorax. Significantly improved aeration of the left lung. Stable atelectasis/airspace disease of the right lower lung. IMPRESSION: Central line tip is in the SVC. No pneumothorax. Interval improved aeration of the left lung. Electronically Signed   By: Aletta Edouard M.D.   On: 12/15/2016 13:50   Dg Chest Port 1 View  Result Date: 12/15/2016 CLINICAL DATA:  Respiratory failure requiring intubation EXAM: PORTABLE CHEST 1 VIEW COMPARISON:  12/14/2016 FINDINGS: Endotracheal tube and remaining support devices are stable. Diffuse left lung airspace disease with near complete white out.  Diffuse right lung airspace disease, slightly worsened since prior study. Findings could represent edema or infection. IMPRESSION: Near complete white out of the left lung with increasing diffuse right lung airspace disease. Electronically Signed   By: Rolm Baptise M.D.   On: 12/15/2016 07:43   Dg Chest Port 1 View  Result Date: 12/14/2016 CLINICAL DATA:  Endotracheal and NG tube placements. EXAM: PORTABLE CHEST 1 VIEW COMPARISON:  12/13/2016 FINDINGS: Endotracheal tube tip is in the right mainstem bronchus and knees to be pulled back at least 5 cm. Enteric tube tip is off the field of view. Cardiac enlargement with pulmonary vascular congestion and bilateral perihilar infiltrates, progressing since previous study. This is likely edema. Small bilateral pleural effusions are present. Volume loss in the left lung. No pneumothorax. There appears to be a PICC line with tip in the right upper arm. IMPRESSION: Endotracheal tube tip in the right mainstem bronchus and needs to be pulled back. Cardiac enlargement with increasing vascular congestion and bilateral perihilar edema since previous study. Bilateral pleural effusions are also progressing. Volume loss in the left lung. These results were called by telephone at the time of interpretation on 12/14/2016 at 11:43 pm to [place], Patients nurse on [place], who verbally acknowledged these results. Electronically Signed   By: Lucienne Capers M.D.   On: 12/14/2016 23:55      Assessment/Plan:  INTERVAL HISTORY: TTE did not visualize tricuspid valve   Principal Problem:   Staphylococcus aureus bacteremia with sepsis (HCC) Active Problems:   Tobacco abuse   IVDU (intravenous drug user)   Depression with anxiety   Septic arthritis of knee, left (HCC)   Endocarditis of tricuspid valve   Normocytic anemia   Polysubstance abuse   Pain syndrome, chronic   GERD (gastroesophageal reflux disease)  Sacroiliitis ([place])   Acute pulmonary embolism (HCC)   Acute  hypoxemic respiratory failure ([place])   Long term current use of opiate analgesic   Septic pulmonary embolism (HCC)   Hepatitis C antibody test positive   Cigarette smoker   Encounter for intubation   Encounter for nasogastric tube placement   Respiratory failure requiring intubation ([place])    Gabriella Ramos is a 41 y.o. 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  #1 MSSA bacteremia tricuspid valve endocarditis and septic knee:  --I'm going to discontinue her midline [facility] at [facility]. I have the hospitalists found documentation of clearance of her bacteremia prior to placement of the central line but I don't ask he have that documentation in hand given her recent events I think is good idea to get rid of this catheter  We may want to consider getting all of her central lines out at some point in giving her a catheter holiday.  I think obtaining a transesophageal echocardiogram here at [facility] may be a good idea given all this happened recently and desire to make sure that we give her the right type of antibiotics. For example if she has significant left sided endocarditis she might need a antibiotic that penetrates into the central nervous system better than cefazolin did.  #2 Septic knee that is post open arthrotomy by orthopedic surgery  #3 pleural effusions: May indeed need chest tube per [facility]  #4 hepatitis C positive antibody needs quantitative hep C and genotype tested treatment will be deferred to an outpatient basis  #5 IV drug use she says that she is scared to use again. She is going to be need to be very diligent and going to be engaged in a treatment program for this long-term.  #6 HCAP: I doubt she has an actual H CAP With a new organism. This is undoubtedly all staph aureus with embolization to the lungs:  There are antibiotics as the BAL cultures come back.  #7 sacroiliitis: She is going to get a protracted course of IV antibiotics would reimage it back pain worsens.  I spent greater than 35  minutes with the patient including greater than 50% of time in face to face counsel of the patient regarding her infected heart valve septic embolic lungs infected knee and in coordination of her care.    LOS: 2 days   Alcide Evener 12/16/2016, 12:05 PM

## 2016-12-16 NOTE — Progress Notes (Signed)
Initial Nutrition Assessment  DOCUMENTATION CODES:   Obesity unspecified  INTERVENTION:   Initiate Vital High Protein @ 10 ml/hr (240 ml/day) 60 ml Prostat TID Provides: 840 kcal, 111 grams protein, and 200 ml free water. TF regimen and propofol at current rate providing 1336 total kcal/day   NUTRITION DIAGNOSIS:   Inadequate oral intake related to inability to eat as evidenced by NPO status.  GOAL:   Provide needs based on ASPEN/SCCM guidelines  MONITOR:   TF tolerance, Vent status, I & O's  REASON FOR ASSESSMENT:   Consult Enteral/tube feeding initiation and management  ASSESSMENT:   Pt with PMH of polysubstance abuse, tobacco abuse, anemia, IVDU, chronic pain syndrome, depression, anxiety, GERD, constipation, recent acute endocarditis with tricuspid vegetation and was at [facility] for IV abx, right knee septic arthritis s/p washout who presents with left knee pain and swelling.   Large R effusion Patient is currently intubated on ventilator support Propofol: 18.8 ml/hr provides: 496 kcal per day from lipid   Diet Order:    NPO  Skin:  Reviewed, no issues  Last BM:  2/5  Height:   Ht Readings from Last 1 Encounters:  12/15/16 5\' 4"  (1.626 m)    Weight:   Wt Readings from Last 1 Encounters:  12/16/16 207 lb 0.2 oz (93.9 kg)    Ideal Body Weight:  54.5 kg  BMI:  Body mass index is 35.53 kg/m.  Estimated Nutritional Needs:   Kcal:  [phone]  Protein:  >/= 109 grams  Fluid:  > 1.5 L/day  EDUCATION NEEDS:   No education needs identified at this time  Kendell BaneHeather Arrian Manson RD, LDN, CNSC [phone] Pager [phone] After Hours Pager

## 2016-12-17 ENCOUNTER — Inpatient Hospital Stay (HOSPITAL_COMMUNITY): Payer: Medicaid Other

## 2016-12-17 DIAGNOSIS — J9601 Acute respiratory failure with hypoxia: Secondary | ICD-10-CM | POA: Diagnosis present

## 2016-12-17 LAB — COMPREHENSIVE METABOLIC PANEL
ALBUMIN: 1.6 g/dL — AB (ref 3.5–5.0)
ALT: <5 U/L — ABNORMAL LOW (ref 14–54)
ANION GAP: 8 (ref 5–15)
AST: 9 U/L — ABNORMAL LOW (ref 15–41)
Alkaline Phosphatase: 106 U/L (ref 38–126)
BUN: 16 mg/dL (ref 6–20)
CO2: 23 mmol/L (ref 22–32)
Calcium: 7.7 mg/dL — ABNORMAL LOW (ref 8.9–10.3)
Chloride: 104 mmol/L (ref 101–111)
Creatinine, Ser: 1.12 mg/dL — ABNORMAL HIGH (ref 0.44–1.00)
GFR calc Af Amer: >60 mL/min (ref >60)
GFR calc non Af Amer: >60 mL/min (ref >60)
GLUCOSE: 92 mg/dL (ref 65–99)
POTASSIUM: 3.5 mmol/L (ref 3.5–5.1)
SODIUM: 135 mmol/L (ref 135–145)
Total Bilirubin: 0.8 mg/dL (ref 0.3–1.2)
Total Protein: 5.8 g/dL — ABNORMAL LOW (ref 6.5–8.1)

## 2016-12-17 LAB — BODY FLUID CELL COUNT WITH DIFFERENTIAL
Lymphs, Fluid: 68 %
Monocyte-Macrophage-Serous Fluid: 8 % — ABNORMAL LOW (ref 50–90)
NEUTROPHIL FLUID: 24 % (ref 0–25)
WBC FLUID: 500 uL (ref 0–1000)

## 2016-12-17 LAB — VANCOMYCIN, TROUGH
VANCOMYCIN TR: 45 ug/mL — AB (ref 15–20)
Vancomycin Tr: 44 ug/mL (ref 15–20)

## 2016-12-17 LAB — CBC WITH DIFFERENTIAL/PLATELET
BASOS ABS: 0 10*3/uL (ref 0.0–0.1)
Basophils Relative: 0 %
EOS ABS: 0.1 10*3/uL (ref 0.0–0.7)
Eosinophils Relative: 1 %
HCT: 22.9 % — ABNORMAL LOW (ref 36.0–46.0)
HEMOGLOBIN: 7.2 g/dL — AB (ref 12.0–15.0)
LYMPHS ABS: 1.3 10*3/uL (ref 0.7–4.0)
LYMPHS PCT: 19 %
MCH: 27.2 pg (ref 26.0–34.0)
MCHC: 31.4 g/dL (ref 30.0–36.0)
MCV: 86.4 fL (ref 78.0–100.0)
Monocytes Absolute: 0.5 10*3/uL (ref 0.1–1.0)
Monocytes Relative: 8 %
NEUTROS PCT: 72 %
Neutro Abs: 5.1 10*3/uL (ref 1.7–7.7)
Platelets: 278 10*3/uL (ref 150–400)
RBC: 2.65 MIL/uL — AB (ref 3.87–5.11)
RDW: 15.5 % (ref 11.5–15.5)
WBC: 7.1 10*3/uL (ref 4.0–10.5)

## 2016-12-17 LAB — PROTEIN, PLEURAL OR PERITONEAL FLUID

## 2016-12-17 LAB — PROTIME-INR
INR: 1.17
PROTHROMBIN TIME: 14.9 s (ref 11.4–15.2)

## 2016-12-17 LAB — LACTATE DEHYDROGENASE: LDH: 129 U/L (ref 98–192)

## 2016-12-17 LAB — MAGNESIUM: Magnesium: 1.5 mg/dL — ABNORMAL LOW (ref 1.7–2.4)

## 2016-12-17 LAB — PROTEIN, TOTAL: Total Protein: 6.2 g/dL — ABNORMAL LOW (ref 6.5–8.1)

## 2016-12-17 LAB — PHOSPHORUS: Phosphorus: 4.4 mg/dL (ref 2.5–4.6)

## 2016-12-17 LAB — LACTATE DEHYDROGENASE, PLEURAL OR PERITONEAL FLUID: LD FL: 475 U/L — AB (ref 3–23)

## 2016-12-17 LAB — GLUCOSE, CAPILLARY
GLUCOSE-CAPILLARY: 104 mg/dL — AB (ref 65–99)
GLUCOSE-CAPILLARY: 83 mg/dL (ref 65–99)
Glucose-Capillary: 96 mg/dL (ref 65–99)

## 2016-12-17 MED ORDER — FENTANYL CITRATE (PF) 100 MCG/2ML IJ SOLN
50.0000 ug | Freq: Once | INTRAMUSCULAR | Status: AC
  Administered 2016-12-17: 50 ug via INTRAVENOUS

## 2016-12-17 MED ORDER — FENTANYL CITRATE (PF) 100 MCG/2ML IJ SOLN
50.0000 ug | Freq: Once | INTRAMUSCULAR | Status: AC
  Administered 2016-12-17 (×2): 50 ug via INTRAVENOUS

## 2016-12-17 MED ORDER — MIDAZOLAM HCL 2 MG/2ML IJ SOLN
1.0000 mg | Freq: Once | INTRAMUSCULAR | Status: AC
Start: 2016-12-17 — End: 2016-12-17
  Administered 2016-12-17: 1 mg via INTRAVENOUS

## 2016-12-17 MED ORDER — HYDROMORPHONE HCL 1 MG/ML IJ SOLN
1.0000 mg | Freq: Once | INTRAMUSCULAR | Status: AC
  Administered 2016-12-17: 1 mg via INTRAVENOUS

## 2016-12-17 MED ORDER — FENTANYL CITRATE (PF) 100 MCG/2ML IJ SOLN
INTRAMUSCULAR | Status: AC
  Administered 2016-12-17: 50 ug via INTRAVENOUS
  Filled 2016-12-17: qty 4

## 2016-12-17 MED ORDER — MIDAZOLAM HCL 2 MG/2ML IJ SOLN: 1.0000 mg | Freq: Once | INTRAMUSCULAR | Status: DC

## 2016-12-17 MED ORDER — ACETYLCYSTEINE 20 % IN SOLN
4.0000 mL | Freq: Two times a day (BID) | RESPIRATORY_TRACT | Status: DC
  Administered 2016-12-17 – 2016-12-18 (×3): 4 mL via RESPIRATORY_TRACT
  Filled 2016-12-17 (×3): qty 4

## 2016-12-17 MED ORDER — POTASSIUM CHLORIDE CRYS ER 20 MEQ PO TBCR
40.0000 meq | EXTENDED_RELEASE_TABLET | Freq: Once | ORAL | Status: AC
  Administered 2016-12-17: 40 meq via ORAL
  Filled 2016-12-17: qty 2

## 2016-12-17 MED ORDER — LIDOCAINE HCL (PF) 1 % IJ SOLN
INTRAMUSCULAR | Status: AC
  Administered 2016-12-17: 15 mL
  Filled 2016-12-17: qty 5

## 2016-12-17 MED ORDER — FENTANYL CITRATE (PF) 100 MCG/2ML IJ SOLN
25.0000 ug | INTRAMUSCULAR | Status: DC | PRN
  Administered 2016-12-17 – 2016-12-18 (×8): 25 ug via INTRAVENOUS
  Filled 2016-12-17 (×8): qty 2

## 2016-12-17 MED ORDER — MIDAZOLAM HCL 2 MG/2ML IJ SOLN
INTRAMUSCULAR | Status: AC
  Administered 2016-12-17: 1 mg
  Filled 2016-12-17: qty 4

## 2016-12-17 MED ORDER — MORPHINE SULFATE (PF) 2 MG/ML IV SOLN
2.0000 mg | INTRAVENOUS | Status: DC | PRN
  Administered 2016-12-17 (×3): 4 mg via INTRAVENOUS
  Administered 2016-12-18: 2 mg via INTRAVENOUS
  Administered 2016-12-18 (×7): 4 mg via INTRAVENOUS
  Filled 2016-12-17 (×7): qty 2
  Filled 2016-12-17: qty 1
  Filled 2016-12-17 (×4): qty 2

## 2016-12-17 MED ORDER — HYDROMORPHONE HCL 1 MG/ML IJ SOLN
INTRAMUSCULAR | Status: AC
  Filled 2016-12-17: qty 1

## 2016-12-17 MED ORDER — FENTANYL CITRATE (PF) 100 MCG/2ML IJ SOLN: 50.0000 ug | Freq: Once | INTRAMUSCULAR | Status: DC

## 2016-12-17 MED ORDER — MORPHINE SULFATE (PF) 2 MG/ML IV SOLN
2.0000 mg | INTRAVENOUS | Status: DC | PRN
  Administered 2016-12-17 (×2): 2 mg via INTRAVENOUS
  Administered 2016-12-17: 4 mg via INTRAVENOUS
  Filled 2016-12-17 (×2): qty 1
  Filled 2016-12-17: qty 2

## 2016-12-17 MED ORDER — CYCLOBENZAPRINE HCL 5 MG PO TABS
7.5000 mg | ORAL_TABLET | Freq: Three times a day (TID) | ORAL | Status: DC | PRN
  Administered 2016-12-17: 7.5 mg via ORAL
  Filled 2016-12-17 (×2): qty 1.5

## 2016-12-17 MED ORDER — KETOROLAC TROMETHAMINE 30 MG/ML IJ SOLN: 30.0000 mg | Freq: Once | INTRAMUSCULAR | Status: DC

## 2016-12-17 MED ORDER — ORAL CARE MOUTH RINSE: 15.0000 mL | Freq: Two times a day (BID) | OROMUCOSAL | Status: DC

## 2016-12-17 NOTE — Progress Notes (Signed)
Pharmacy Antibiotic Note  Kassie MendsRachel Lynn Gullo is a 41 y.o. female admitted on 12/14/2016 with HCAP and recent diagnosis of MSSA bacteremia, septic knee arthritis, and TV endocarditis.  Pt had been on Ancef w/ plan for 8wk w/ stop date of 3/13. Coverage was broadened to Vancomycin + Zosyn on 2/11 to cover for potential HCAP.   A Vancomycin trough this evening resulted as SUPRAtherapeutic at 45 mcg/ml - lab asked for it to be repeated since the sample was hemolyzed. The repeat level remained elevated at 44 mcg/ml. Per ID's note today - it appears as if the intention was to narrow the patient to Rocephin monotherapy. This was discussed with ID Daiva Eves([place]) and clarified - will discontinue Vancomycin this evening.   Plan: 1. Discontinue Vancomycin  2. Continue Rocephin 2g IV every 24 hours per ID 3. Will continue to follow ID work up, cultures, and LOT plans  Height: 5\' 4"  (162.6 cm) Weight: 204 lb 12.9 oz (92.9 kg) IBW/kg (Calculated) : 54.7  Temp (24hrs), Avg:98.9 F (37.2 C), Min:98 F (36.7 C), Max:99.2 F (37.3 C)   Recent Labs Lab 12/11/16 0023  12/12/16 0424 12/13/16 0426 12/14/16 2258 12/14/16 2330 12/15/16 0922 12/15/16 1639 12/15/16 2018 12/16/16 0436 12/16/16 0656 12/17/16 0707 12/17/16 1638 12/17/16 1803  WBC 6.5  < > 5.5 6.1 13.7*  --  8.0 7.2  7.3 6.0 7.8  --  7.1  --   --   CREATININE 0.97  < > 0.87 0.86 0.99  --   --   --   --   --  1.09* 1.12*  --   --   LATICACIDVEN 0.7  --   --   --   --  1.6  --   --   --   --   --   --   --   --   VANCOTROUGH  --   --   --   --   --   --   --   --   --   --   --   --  44* 45*  < > = values in this interval not displayed.  Estimated Creatinine Clearance: 75.3 mL/min (by C-G formula based on SCr of 1.12 mg/dL (H)).    Allergies  Allergen Reactions  . Ketorolac Tromethamine Shortness Of Breath  . Sulfa Antibiotics    Thank you for allowing pharmacy to be a part of this patient's care.  Georgina PillionElizabeth Yessika Otte, PharmD,  BCPS Clinical Pharmacist Pager: [phone] 12/17/2016 7:28 PM

## 2016-12-17 NOTE — Progress Notes (Signed)
   LB PCCM  Pt self extubated this am. Per RN, she is comfortable. (-) resp distress. (-) stridor. On 4L [place], o2 sats 99%. Will observe for now. RT at bedside.  Pollie MeyerJ. Angelo A de Dios, MD 12/17/2016, 7:45 AM [facility] Pager [phone] After 3 pm or if no answer, call [phone]

## 2016-12-17 NOTE — Progress Notes (Signed)
RT called due to patient self-extubating. Patient placed on 3 Lpm nasal cannula. No stridor or dyspnea noted. Patient able to follow commands and answer questions appropriately. RN at bedside.

## 2016-12-17 NOTE — Progress Notes (Signed)
240 cc of Fentanyl wasted in sink witnessed by 2 RN's, [facility] Children[facility]ica Corabelle Spackman & Hoyle Sauerheresa O'Leary, RN. [place], Mitzi HansenJessica Marie

## 2016-12-17 NOTE — Plan of Care (Signed)
Problem: Respiratory: Goal: Ability to maintain a clear airway and adequate ventilation will improve Outcome: Progressing Pt self-extubated this morning.  Airway remains open and saturation levels remain WNL on 2L [place].

## 2016-12-17 NOTE — Procedures (Signed)
US chest right  1. Large effusion, hint of some upper loculation, free flowing base, sig atx  Mcarthur Rossettianiel J. Tyson AliasFeinstein, MD, FACP Pgr: [phone] [facility]

## 2016-12-17 NOTE — Progress Notes (Addendum)
During shift report, patient extubated herself.  Sedation stopped, patient bagged and RT notified.  Patient awake and A/O x3.  No stridor or respiratory distress.  Patient placed on 3L [place] and sats 100%.  Dr. Christene Slatese Dios notified.  Will continue to monitor. [place], Mitzi HansenJessica Marie

## 2016-12-17 NOTE — Progress Notes (Signed)
Pharmacy Antibiotic Note  Gabriella Ramos is a 41 y.o. female admitted on 12/14/2016 with HCAP and recent diagnosis of MSSA bacteremia, septic knee arthritis, and TV endocarditis.  Pt had been on Ancef w/ plan for 8wk w/ stop date of 3/13, now to broaden coverage.  Pharmacy has been consulted for Vancocin and Zosyn dosing. ID is also following  Plan: Vancomycin 1042m IV every 8 hours.  Goal trough 15-20 mcg/mL. Zosyn 3.375g IV q8h (4 hour infusion).  Height: _0  (162.6 cm) Weight: 204 lb 12.9 oz (92.9 kg) IBW/kg (Calculated) : 54.7  Temp (24hrs), Avg:98.8 F (37.1 C), Min:98 F (36.7 C), Max:99.2 F (37.3 C)   Recent Labs Lab 12/11/16 0023  12/12/16 0424 12/13/16 0426 12/14/16 2258 12/14/16 2330 12/15/16 0922 12/15/16 1639 12/15/16 2018 12/16/16 0436 12/16/16 0656 12/17/16 0707  WBC 6.5  < > 5.5 6.1 13.7*  --  8.0 7.2  7.3 6.0 7.8  --  7.1  CREATININE 0.97  < > 0.87 0.86 0.99  --   --   --   --   --  1.09* 1.12*  LATICACIDVEN 0.7  --   --   --   --  1.6  --   --   --   --   --   --   < > = values in this interval not displayed.  Estimated Creatinine Clearance: 75.3 mL/min (by C-G formula based on SCr of 1.12 mg/dL (H)).    Allergies  Allergen Reactions  . Ketorolac Tromethamine Shortness Of Breath  . Sulfa Antibiotics     2/11 zosyn 2/11 vanc  2/11 Bcx ngtd 2/11 BAL- ngtd 2/7 blood x2- neg 2/7 MRSA PCR- neg  Thank you for allowing pharmacy to be a part of this patient's care.  AHildred Laser Pharm D 12/17/2016 8:26 AM

## 2016-12-17 NOTE — Progress Notes (Signed)
eLink Physician-Brief Progress Note Patient Name: Gabriella LeachRachel Lynn Ramos DOB: 1976/02/14 MRN: [id number]   Date of Service  12/17/2016  HPI/Events of Note  Patient c/o pain related to chest tube and back spasm. Already on Morphine 2-4 mg IV Q 3 hours PRN.   eICU Interventions  Will order: 1. Increase Morphine to 2-4 mg IV Q 2 hours PRN pain. 2. Flexeril 7.5 mg PO now and Q 8 hours PRN spasm.      Intervention Category Intermediate Interventions: Pain - evaluation and management  Sommer,Steven Eugene 12/17/2016, 4:45 PM

## 2016-12-17 NOTE — Progress Notes (Signed)
Subjective:   She is complaining of her left knee worsening pain  Antibiotics:  Anti-infectives    Start     Dose/Rate Route Frequency Ordered Stop   12/16/16 1800  cefTRIAXone (ROCEPHIN) 2 g in dextrose 5 % 50 mL IVPB     2 g 100 mL/hr over 30 Minutes Intravenous Every 24 hours 12/16/16 1211     12/15/16 1000  vancomycin (VANCOCIN) IVPB 1000 mg/200 mL premix     1,000 mg 200 mL/hr over 60 Minutes Intravenous Every 8 hours 12/15/16 0408     12/15/16 1000  piperacillin-tazobactam (ZOSYN) IVPB 3.375 g  Status:  Discontinued     3.375 g 12.5 mL/hr over 240 Minutes Intravenous Every 8 hours 12/15/16 0408 12/16/16 1211   12/15/16 0415  vancomycin (VANCOCIN) IVPB 1000 mg/200 mL premix     1,000 mg 200 mL/hr over 60 Minutes Intravenous  Once 12/15/16 0408 12/15/16 0657   12/15/16 0415  piperacillin-tazobactam (ZOSYN) IVPB 3.375 g     3.375 g 100 mL/hr over 30 Minutes Intravenous  Once 12/15/16 0408 12/15/16 0603      Medications: Scheduled Meds: . acetylcysteine  4 mL Nebulization Q12H  . cefTRIAXone (ROCEPHIN)  IV  2 g Intravenous Q24H  . Chlorhexidine Gluconate Cloth  6 each Topical Daily  . fentaNYL (SUBLIMAZE) injection  50 mcg Intravenous Once  . furosemide  40 mg Intravenous Q12H  . heparin subcutaneous  5,000 Units Subcutaneous Q8H  . mouth rinse  15 mL Mouth Rinse BID  . midazolam  1 mg Intravenous Once  . multivitamin  15 mL Per Tube Daily  . pantoprazole (PROTONIX) IV  40 mg Intravenous Q12H  . sodium chloride flush  10-40 mL Intracatheter Q12H  . vancomycin  1,000 mg Intravenous Q8H   Continuous Infusions: . sodium chloride Stopped (12/17/16 1200)   PRN Meds:.fentaNYL (SUBLIMAZE) injection, levalbuterol, morphine injection, sodium chloride flush    Objective: Weight change: -2 lb 3.3 oz (-1 kg)  Intake/Output Summary (Last 24 hours) at 12/17/16 1608 Last data filed at 12/17/16 1330  Gross per 24 hour  Intake          1858.16 ml  Output              3785 ml  Net         -1926.84 ml   Blood pressure 140/90, pulse 94, temperature 99.2 F (37.3 C), temperature source Oral, resp. rate (!) 32, height _0  (1.626 m), weight 204 lb 12.9 oz (92.9 kg), SpO2 97 %. Temp:  [98 F (36.7 C)-99.2 F (37.3 C)] 99.2 F (37.3 C) (02/13 1200) Pulse Rate:  [70-100] 94 (02/13 1400) Resp:  [0-32] 32 (02/13 1400) BP: (96-145)/(62-111) 140/90 (02/13 1400) SpO2:  [91 %-100 %] 97 % (02/13 1400) FiO2 (%):  [40 %] 40 % (02/13 0416) Weight:  [204 lb 12.9 oz (92.9 kg)] 204 lb 12.9 oz (92.9 kg) (02/13 0326)  Physical Exam: General: Alert and awake, oriented x3, complaining of pain diffusely but in particular left knee HEENT: anicteric sclera, pupils reactive to light and accommodation, EOMI CVS tachycardic, normal r,  no murmur rubs or gallops Chest: Relatively clear to auscultation bilaterally anteriorly no wheezing Abdomen: soft nontender, nondistended, normal bowel sounds, Extremities: no  clubbing or edema noted bilaterally Skin: no rashes Neuro: nonfocal  CBC:  CBC Latest Ref Rng & Units 12/17/2016 12/16/2016 12/15/2016  WBC 4.0 - 10.5 K/uL 7.1 7.8 6.0  Hemoglobin 12.0 - 15.0  g/dL 7.2(L) 7.9(L) 7.5(L)  Hematocrit 36.0 - 46.0 % 22.9(L) 25.4(L) 24.2(L)  Platelets 150 - 400 K/uL 278 251 231     BMET  Recent Labs  12/16/16 0656 12/17/16 0707  NA 139 135  K 4.4 3.5  CL 107 104  CO2 25 23  GLUCOSE 87 92  BUN 14 16  CREATININE 1.09* 1.12*  CALCIUM 8.0* 7.7*     Liver Panel   Recent Labs  12/17/16 0707 12/17/16 1130  PROT 5.8* 6.2*  ALBUMIN 1.6*  --   AST 9*  --   ALT <5*  --   ALKPHOS 106  --   BILITOT 0.8  --        Sedimentation Rate No results for input(s): ESRSEDRATE in the last 72 hours. C-Reactive Protein No results for input(s): CRP in the last 72 hours.  Micro Results: Recent Results (from the past 720 hour(s))  Urine culture     Status: Abnormal   Collection Time: 12/10/16 10:21 PM  Result Value Ref Range  Status   Specimen Description URINE, CLEAN CATCH  Final   Special Requests NONE  Final   Culture <10,000 COLONIES/mL INSIGNIFICANT GROWTH (A)  Final   Report Status 12/14/2016 FINAL  Final  Culture, blood (Routine X 2) w Reflex to ID Panel     Status: None   Collection Time: 12/11/16 12:23 AM  Result Value Ref Range Status   Specimen Description BLOOD LEFT ANTECUBITAL  Final   Special Requests IN PEDIATRIC BOTTLE 2CC  Final   Culture NO GROWTH 5 DAYS  Final   Report Status 12/16/2016 FINAL  Final  Culture, blood (Routine X 2) w Reflex to ID Panel     Status: None   Collection Time: 12/11/16 12:23 AM  Result Value Ref Range Status   Specimen Description BLOOD LEFT HAND  Final   Special Requests IN PEDIATRIC BOTTLE 2CC  Final   Culture NO GROWTH 5 DAYS  Final   Report Status 12/16/2016 FINAL  Final  Surgical PCR screen     Status: None   Collection Time: 12/11/16  5:13 PM  Result Value Ref Range Status   MRSA, PCR NEGATIVE NEGATIVE Final   Staphylococcus aureus NEGATIVE NEGATIVE Final    Comment:        The Xpert SA Assay (FDA approved for NASAL specimens in patients over 49 years of age), is one component of a comprehensive surveillance program.  Test performance has been validated by [facility] for patients greater than or equal to 53 year old. It is not intended to diagnose infection nor to guide or monitor treatment.   Aerobic/Anaerobic Culture (surgical/deep wound)     Status: None   Collection Time: 12/11/16  7:12 PM  Result Value Ref Range Status   Specimen Description TISSUE LEFT KNEE  Final   Special Requests SYNOVIAL  Final   Gram Stain   Final    ABUNDANT WBC PRESENT,BOTH PMN AND MONONUCLEAR NO ORGANISMS SEEN    Culture No growth aerobically or anaerobically.  Final   Report Status 12/16/2016 FINAL  Final  MRSA PCR Screening     Status: None   Collection Time: 12/14/16 10:47 PM  Result Value Ref Range Status   MRSA by PCR NEGATIVE NEGATIVE Final     Comment:        The GeneXpert MRSA Assay (FDA approved for NASAL specimens only), is one component of a comprehensive MRSA colonization surveillance program. It is not intended to diagnose MRSA  infection nor to guide or monitor treatment for MRSA infections.   Culture, blood (routine x 2)     Status: None (Preliminary result)   Collection Time: 12/15/16  1:54 AM  Result Value Ref Range Status   Specimen Description BLOOD LEFT ANTECUBITAL  Final   Special Requests BOTTLES DRAWN AEROBIC AND ANAEROBIC  5CC EA  Final   Culture NO GROWTH 2 DAYS  Final   Report Status PENDING  Incomplete  Culture, blood (routine x 2)     Status: None (Preliminary result)   Collection Time: 12/15/16  1:55 AM  Result Value Ref Range Status   Specimen Description BLOOD BLOOD LEFT HAND  Final   Special Requests BOTTLES DRAWN AEROBIC ONLY  4CC  Final   Culture NO GROWTH 2 DAYS  Final   Report Status PENDING  Incomplete  Culture, bal-quantitative     Status: None (Preliminary result)   Collection Time: 12/15/16 10:16 AM  Result Value Ref Range Status   Specimen Description BRONCHIAL ALVEOLAR LAVAGE  Final   Special Requests Normal  Final   Gram Stain   Final    RARE WBC PRESENT, PREDOMINANTLY PMN NO ORGANISMS SEEN    Culture CULTURE REINCUBATED FOR BETTER GROWTH  Final   Report Status PENDING  Incomplete    Studies/Results: Ct Angio Chest Pe W Or Wo Contrast  Result Date: 12/15/2016 CLINICAL DATA:  Shortness of Breath EXAM: CT ANGIOGRAPHY CHEST WITH CONTRAST TECHNIQUE: Multidetector CT imaging of the chest was performed using the standard protocol during bolus administration of intravenous contrast. Multiplanar CT image reconstructions and MIPs were obtained to evaluate the vascular anatomy. CONTRAST:  100 mL Isovue 370 COMPARISON:  11/26/2016 FINDINGS: Cardiovascular: Thoracic aorta is within normal limits without aneurysmal dilatation or dissection. Heavy coronary calcifications are seen. The pulmonary  artery is well visualized without definitive filling defects to suggest pulmonary embolism. Mediastinum/Nodes: Thoracic inlet is within normal limits. An endotracheal tube and nasogastric catheter are noted in satisfactory position. Scattered mediastinal lymph nodes are noted. Prevascular node is seen on image number 29 of series 6 measuring 1.4 cm in short axis. Right peritracheal node is noted measuring 1.7 cm in short axis. Scattered small hilar nodes are noted as well. Lungs/Pleura: Bilateral pleural effusions are noted right greater than left. Diffuse bilateral consolidation is seen which has increased significantly in the interval from the prior exam. Upper Abdomen: Within normal limits. Musculoskeletal: No acute bony abnormality is seen. Review of the MIP images confirms the above findings. IMPRESSION: New patchy bilateral infiltrate with associated large pleural effusions. No evidence of pulmonary emboli. Scattered mediastinal and hilar lymph nodes which have increased when compared with the prior exam likely of reactive nature. Electronically Signed   By: Inez Catalina M.D.   On: 12/15/2016 17:38   US Renal Port  Result Date: 12/15/2016 CLINICAL DATA:  Decreased urine output.  UTI. EXAM: RENAL / URINARY TRACT ULTRASOUND COMPLETE COMPARISON:  None. FINDINGS: Right Kidney: Length: 13.2 cm. Increased cortical echogenicity and cortical thinning. Left Kidney: Length: 14 cm. Increased cortical echogenicity and cortical thinning. Bladder: The bladder is decompressed with a Foley catheter. Small left pleural effusion, large right pleural effusion, and mild ascites. IMPRESSION: 1. Medical renal disease.  No hydronephrosis. 2. Mild ascites. Bilateral pleural effusions, right greater than left. Electronically Signed   By: Dorise Bullion III M.D   On: 12/15/2016 20:32   Dg Chest Port 1 View  Result Date: 12/17/2016 CLINICAL DATA:  Pleural effusion, right chest tube EXAM:  PORTABLE CHEST 1 VIEW COMPARISON:   12/17/2016 FINDINGS: Interval extubation and removal of NG tube. Left internal jugular central line remains in place, unchanged. Interval placement of right chest tube. Decreasing right effusion. No pneumothorax. Cardiomegaly with vascular congestion and bilateral airspace disease, likely edema. IMPRESSION: Interval placement of right chest tube without visible pneumothorax. Decreasing right effusion. Continued diffuse bilateral airspace disease, likely CHF. Electronically Signed   By: Rolm Baptise M.D.   On: 12/17/2016 11:23   Dg Chest Port 1 View  Result Date: 12/17/2016 CLINICAL DATA:  Pleural effusion, polysubstance abuse, intubated patient, respiratory failure. EXAM: PORTABLE CHEST 1 VIEW COMPARISON:  Portable chest x-ray of December 16, 2016 FINDINGS: The lungs are reasonably well inflated. The interstitial markings remain increased. The hemidiaphragms remain obscured. The pulmonary vascularity remains engorged. Confluent alveolar opacity in the right mid lung is stable. The cardiac silhouette remains enlarged. The endotracheal tube tip 3.2 cm above the carina. The esophagogastric tube tip and proximal port project below the inferior margin of the image. The left internal jugular venous catheter tip projects over the midportion of the SVC. IMPRESSION: CHF with pulmonary interstitial and alveolar edema and bilateral posterior layering pleural effusions. Probable confluent pneumonia in the right mid lung and left lower lobes. There has not been significant interval change since yesterday's study. The support tubes are in reasonable position. Electronically Signed   By: David  [place]ique M.D.   On: 12/17/2016 07:00   Dg Chest Port 1 View  Result Date: 12/16/2016 CLINICAL DATA:  Pulmonary embolus. EXAM: PORTABLE CHEST 1 VIEW COMPARISON:  CT 12/15/2016 .  Chest x-ray 12/15/2016. FINDINGS: Endotracheal tube, NG tube, left IJ line stable position. Cardiomegaly with bilateral infiltrates/ and bilateral pleural  effusions. Interim improvement from prior exam. No pneumothorax. IMPRESSION: 1. Lines and tubes in stable position. 2. Cardiomegaly with bilateral pulmonary infiltrates/edema and bilateral pleural effusions. Interim improvement from prior exam. Electronically Signed   By: [place]   On: 12/16/2016 07:26      Assessment/Plan:  INTERVAL HISTORY:   Patient self extubated  Patient is status post thoracocentesis   Principal Problem:   Staphylococcus aureus bacteremia with sepsis ([place]) Active Problems:   Tobacco abuse   IVDU (intravenous drug user)   Depression with anxiety   Septic arthritis of knee, left (HCC)   Endocarditis of tricuspid valve   Normocytic anemia   Polysubstance abuse   Pain syndrome, chronic   GERD (gastroesophageal reflux disease)   Sacroiliitis (HCC)   Acute pulmonary embolism (HCC)   Acute hypoxemic respiratory failure ([place])   Long term current use of opiate analgesic   Septic pulmonary embolism (HCC)   Hepatitis C antibody test positive   Cigarette smoker   Encounter for intubation   Encounter for nasogastric tube placement   Respiratory failure requiring intubation ([place])   Pleural effusion   Acute respiratory failure with hypoxia ([place])    Gabriella Ramos is a 41 y.o. 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  #1 MSSA bacteremia tricuspid valve endocarditis and septic knee:  --I'm going to discontinue her midline was placed at [facility].   We may want to consider getting all of her central lines out at some point in giving her a catheter holiday.  I think obtaining a transesophageal echocardiogram here at [facility] may be a good idea given all this happened recently and desire to  make sure that we give her the right type of antibiotics. For example if she has significant left sided  endocarditis she might need a antibiotic that penetrates into the central nervous system better than cefazolin did.  #2 Septic knee that is post open arthrotomy by orthopedic surgery, she claims knee pain is worth. Would ask [place] to re-examine  #3 pleural effusions: Has post placement of chest tube pleural LDH is elevated but protein low. Sites  #4 hepatitis C positive antibody needs quantitative hep C and genotype tested treatment will be deferred to an outpatient basis  #5 IV drug use she says that she is scared to use again. She is going to be need to be very diligent and going to be engaged in a treatment program for this long-term.  #6 HCAP: I doubt she has an actual H CAP With a new organism. This is undoubtedly all staph aureus with embolization to the lungs:  I will narrow to ceftriaxone  #7 sacroiliitis: She is going to get a protracted course of IV antibiotics would reimage it back pain worsens.  I spent greater than 35  minutes with the patient including greater than 50% of time in face to face counsel of the patient regarding her infected heart valve septic embolic lungs infected knee and in coordination of her care.    LOS: 3 days   Alcide Evener 12/17/2016, 4:08 PM

## 2016-12-17 NOTE — Progress Notes (Signed)
Nutrition Follow-up  DOCUMENTATION CODES:   Obesity unspecified  INTERVENTION:   Ensure Enlive po BID, each supplement provides 350 kcal and 20 grams of protein  NUTRITION DIAGNOSIS:   Inadequate oral intake related to  (decreased appetite) as evidenced by per patient/family report. Ongoing.   GOAL:   Patient will meet greater than or equal to 90% of their needs Not met.   MONITOR:   PO intake, Supplement acceptance, I & O's  ASSESSMENT:   Pt with PMH of polysubstance abuse, tobacco abuse, anemia, IVDU, chronic pain syndrome, depression, anxiety, GERD, constipation, recent acute endocarditis with tricuspid vegetation and was at [facility] for IV abx, right knee septic arthritis s/p washout who presents with left knee pain and swelling.  2/13 self-extubated, s/p CT placement Pt reports decreased appetite.  Pt complaining of a lot of pain  Diet Order:  Diet regular Room service appropriate? Yes; Fluid consistency: Thin NPO  Skin:  Reviewed, no issues  Last BM:  2/14  Height:   Ht Readings from Last 1 Encounters:  12/15/16 _0  (1.626 m)    Weight:   Wt Readings from Last 1 Encounters:  12/17/16 204 lb 12.9 oz (92.9 kg)    Ideal Body Weight:  54.5 kg  BMI:  Body mass index is 35.16 kg/m.  Estimated Nutritional Needs:   Kcal:  1700-1900  Protein:  95-115 grams  Fluid:  > 1.7 L/day  EDUCATION NEEDS:   No education needs identified at this time  [place], [place], [place] Pager [phone] After Hours Pager

## 2016-12-17 NOTE — Progress Notes (Addendum)
PULMONARY / CRITICAL CARE MEDICINE   Name: Gabriella Ramos MRN: [id number] DOB: 07-30-1976    ADMISSION DATE:  12/14/2016   CHIEF COMPLAINT:  Left knee pain  HISTORY OF PRESENT ILLNESS:   41 y.o. female with medical history significant of polysubstance abuse, tobacco abuse, anemia, IVDU, chronic pain syndrome, depression, anxiety, GERD, constipation, who presents with left knee pain and swelling.  Patient was recently admitted to [facility] on 11/25/16 due to right lower lobe pneumonia with multiple bilateral foci. She was found to have acute endocarditis with a fragile large tricuspid vegetation (MSSA on Bcx) that it was 2 cm in size by 2-D echo. She was transferred to [facility] for continuation of IV Abx of Cefazoline for total of 8 weeks with a stop date of 01/13/17. While in [facility] hospital, patient complains of left kidney swelling and pain. Left knee joint aspiration was done, and culture of synovial fluid grew out methicillin-sensitive staph aureus. Ortho was consulted in [facility] hospital and recommended knee washout. Per report, pt has anemia, hemolytic anemia workup tests including indirect bilirubin, LDH, haptoglobin and peripheral smear were sent out, the results are pending. Pt was transfused with one units of blood on 12/09/16. Her Hgb was 7.9 on 12/10/16. Pt had negative left LE venous doppler for DVT on 12/05/16.  Per the [facility] transfer note (in patients chart), on 12/14/2016 she reported pleuritic chest pain and SOB.  CT PE demonstrated Right pulmonary embolism.  Several days ago at [facility] she was noted to have a hemoglobin drop to 6 where she received 2U PRBCs.  No obvious sources of bleeding were found and hemolysis labs negative.  She arrived on the floor and was found to be in acute distress with severe work of breathing and 1 word dyspnea, we elected to intubate immediately.  SUBJECTIVE: just self extubated, neg 670 cc  VITAL SIGNS: Temp:  [98 F (36.7  C)-99.2 F (37.3 C)] 99 F (37.2 C) (02/13 0725) Pulse Rate:  [68-86] 84 (02/13 0725) Resp:  [0-24] 21 (02/13 0725) BP: (94-117)/(62-94) 104/66 (02/13 0725) SpO2:  [92 %-100 %] 98 % (02/13 0725) FiO2 (%):  [40 %] 40 % (02/13 0416) Weight:  [92.9 kg (204 lb 12.9 oz)] 92.9 kg (204 lb 12.9 oz) (02/13 0326)   HEMODYNAMICS: CVP:  [18 mmHg] 18 mmHg VENTILATOR SETTINGS: Vent Mode: PRVC FiO2 (%):  [40 %] 40 % Set Rate:  [24 bmp] 24 bmp Vt Set:  [440 mL] 440 mL PEEP:  [5 cmH20] 5 cmH20 Plateau Pressure:  [22 cmH20-26 cmH20] 26 cmH20 INTAKE / OUTPUT:  Intake/Output Summary (Last 24 hours) at 12/17/16 0829 Last data filed at 12/17/16 8657  Gross per 24 hour  Intake          2138.16 ml  Output             2850 ml  Net          -711.84 ml   PHYSICAL EXAMINATION: General:  Acutely ill young female, no distress Neuro:  Awake, NONFOCAL, appropriate HEENT:  jvd up remains, ett just pulled out by pt Cardiovascular s1 s2 RRR Lungs:  Coarse BS bases Abdomen:  Soft, NT, ND and +BS Skin:  Intact Ext: no toe nails  LABS:  CBC  Recent Labs Lab 12/15/16 2018 12/16/16 0436 12/17/16 0707  WBC 6.0 7.8 7.1  HGB 7.5* 7.9* 7.2*  HCT 24.2* 25.4* 22.9*  PLT 231 251 278   Coag's  Recent Labs Lab 12/11/16 0023 12/14/16 2258  12/16/16 0554 12/17/16 0707  APTT 52* 89*  --   --   INR 1.28 1.34 1.21 1.17   BMET  Recent Labs Lab 12/14/16 2258 12/16/16 0656 12/17/16 0707  NA 139 139 135  K 4.8 4.4 3.5  CL 106 107 104  CO2 22 25 23   BUN 8 14 16   CREATININE 0.99 1.09* 1.12*  GLUCOSE 126* 87 92   Electrolytes  Recent Labs Lab 12/14/16 2258 12/16/16 0656 12/17/16 0707  CALCIUM 8.4* 8.0* 7.7*  MG  --  1.7 1.5*  PHOS  --  4.5 4.4   Sepsis Markers  Recent Labs Lab 12/11/16 0023 12/14/16 2330  LATICACIDVEN 0.7 1.6   ABG  Recent Labs Lab 12/14/16 2255 12/15/16 0512 12/16/16 0452  PHART 7.256* 7.309* 7.299*  PCO2ART 51.9* 49.0* 49.9*  PO2ART 293.0* 183.0* 96.7    Liver Enzymes  Recent Labs Lab 12/12/16 0424 12/17/16 0707  AST 10* 9*  ALT <5* <5*  ALKPHOS 105 106  BILITOT 0.2* 0.8  ALBUMIN 1.5* 1.6*   Cardiac Enzymes  Recent Labs Lab 12/14/16 2258 12/15/16 0922 12/15/16 1639  TROPONINI 0.03* <0.03 <0.03   Glucose  Recent Labs Lab 12/15/16 0409 12/16/16 1638 12/16/16 2018 12/17/16 0008 12/17/16 0419 12/17/16 0806  GLUCAP 116* 88 98 96 104* 83   Imaging Dg Chest Port 1 View  Result Date: 12/17/2016 CLINICAL DATA:  Pleural effusion, polysubstance abuse, intubated patient, respiratory failure. EXAM: PORTABLE CHEST 1 VIEW COMPARISON:  Portable chest x-ray of December 16, 2016 FINDINGS: The lungs are reasonably well inflated. The interstitial markings remain increased. The hemidiaphragms remain obscured. The pulmonary vascularity remains engorged. Confluent alveolar opacity in the right mid lung is stable. The cardiac silhouette remains enlarged. The endotracheal tube tip 3.2 cm above the carina. The esophagogastric tube tip and proximal port project below the inferior margin of the image. The left internal jugular venous catheter tip projects over the midportion of the SVC. IMPRESSION: CHF with pulmonary interstitial and alveolar edema and bilateral posterior layering pleural effusions. Probable confluent pneumonia in the right mid lung and left lower lobes. There has not been significant interval change since yesterday's study. The support tubes are in reasonable position. Electronically Signed   By: David  [place] M.D.   On: 12/17/2016 07:00   ASSESSMENT / PLAN:  PULMONARY A: Acute hypoxemic respiratory failure Right lung white out after right mainstem intubation Submassive PE NOT noted on CT at [facility] Large effusion rt PNA ATX  P:   - pcxr reviewed, did well with lasix -pcxr in am  -monitor for reintubation -no need NIMV for now - add muicomysts nebs to help with ATX -consider chest tube placement vs thora with degree of  atx in setting of treated endocarditis  CARDIOVASCULAR A:  Bacterial endocarditis TV vegitation Elevated troponin and BNP - 2/2 submassive PE  P:  - Blood cultures to follow- remains neg - ID consulted, appreciated - 2D echo ordered- pending, ID recommending TEE, will assess tolerability in 1-2 days as just extubated  RENAL A:   Low output overnight, gross volume overload P:   - BMET in AM - k supp - lasix maintain   GASTROINTESTINAL A:   No overt Gi bleeding noted P:   - TF off -need BM -ppi, may be able to dc  HEMATOLOGIC A:   Anemia of chronic disease Gi bleed concerns at [facility] PE neg at [facility] P:  - Trend CBC in am  -sub q hep - Transfuse per ICU  protocol, if less 7 Lasix to concentrate further  INFECTIOUS A:   MSSA - TV bacterial endocarditis Started Cefazolin on 11/18/2016 plan was for 8 weeks with stop date 01/13/2017  MSSA - Right knee septic arthritis s/p washout  P:   BCx2 - 12/15/2016 UC 12/15/2016 Sputum 12/15/2016 Abx:  Vosyn , ID to see Zosyn- odd, ABX per ID Place chest tube  ENDOCRINE A:   Not active   P:   - CBg assessments  NEUROLOGIC A:   Sedated on propofol Self extubated P:   RASS goal: 0 - Propofol drip- off - Fentanyl drip- of  Ccm time 30 min   Mcarthur Rossetti. Tyson Alias, MD, FACP Pgr: [phone] [facility]

## 2016-12-17 NOTE — Procedures (Addendum)
Chest Tube Insertion Procedure Note RIGHT  Indications:  Clinically significant Effusion, r/o empyema  Pre-operative Diagnosis: Effusion  Post-operative Diagnosis: Effusion  Procedure Details  Informed consent was obtained for the procedure, including sedation.  Risks of lung perforation, hemorrhage, arrhythmia, and adverse drug reaction were discussed.   After sterile skin prep, using standard technique, a 28 French tube was placed in the right lateral 5th rib space. Prior a 20 french attempted bu not pass with not being rigid through a lot of fat tissue  Findings: 1500 immiditate ml of serosanguinous fluid obtained  Estimated Blood Loss:  Minimal         Specimens:  Sent serosanguinous fluid              Complications:  None; patient tolerated the procedure well.         Disposition: ICu  not on vent         Condition: stable  Attending Attestation: I performed the procedure.  Mcarthur Rossettianiel J. Tyson AliasFeinstein, MD, FACP Pgr: [phone] [facility]

## 2016-12-18 ENCOUNTER — Inpatient Hospital Stay (HOSPITAL_COMMUNITY): Payer: Medicaid Other

## 2016-12-18 DIAGNOSIS — J15211 Pneumonia due to Methicillin susceptible Staphylococcus aureus: Secondary | ICD-10-CM

## 2016-12-18 DIAGNOSIS — Y95 Nosocomial condition: Secondary | ICD-10-CM

## 2016-12-18 DIAGNOSIS — Z452 Encounter for adjustment and management of vascular access device: Secondary | ICD-10-CM

## 2016-12-18 DIAGNOSIS — J918 Pleural effusion in other conditions classified elsewhere: Secondary | ICD-10-CM

## 2016-12-18 LAB — CBC WITH DIFFERENTIAL/PLATELET
BASOS ABS: 0 10*3/uL (ref 0.0–0.1)
BASOS PCT: 0 %
EOS PCT: 0 %
Eosinophils Absolute: 0 10*3/uL (ref 0.0–0.7)
HCT: 25.7 % — ABNORMAL LOW (ref 36.0–46.0)
Hemoglobin: 8.3 g/dL — ABNORMAL LOW (ref 12.0–15.0)
Lymphocytes Relative: 10 %
Lymphs Abs: 1.3 10*3/uL (ref 0.7–4.0)
MCH: 27.4 pg (ref 26.0–34.0)
MCHC: 32.3 g/dL (ref 30.0–36.0)
MCV: 84.8 fL (ref 78.0–100.0)
MONO ABS: 0.8 10*3/uL (ref 0.1–1.0)
Monocytes Relative: 7 %
Neutro Abs: 10.7 10*3/uL — ABNORMAL HIGH (ref 1.7–7.7)
Neutrophils Relative %: 83 %
PLATELETS: 387 10*3/uL (ref 150–400)
RBC: 3.03 MIL/uL — ABNORMAL LOW (ref 3.87–5.11)
RDW: 15.5 % (ref 11.5–15.5)
WBC: 12.8 10*3/uL — ABNORMAL HIGH (ref 4.0–10.5)

## 2016-12-18 LAB — COMPREHENSIVE METABOLIC PANEL
ALBUMIN: 1.6 g/dL — AB (ref 3.5–5.0)
ALK PHOS: 129 U/L — AB (ref 38–126)
AST: 13 U/L — AB (ref 15–41)
Anion gap: 10 (ref 5–15)
BILIRUBIN TOTAL: 0.6 mg/dL (ref 0.3–1.2)
BUN: 12 mg/dL (ref 6–20)
CALCIUM: 7.7 mg/dL — AB (ref 8.9–10.3)
CO2: 28 mmol/L (ref 22–32)
CREATININE: 0.92 mg/dL (ref 0.44–1.00)
Chloride: 100 mmol/L — ABNORMAL LOW (ref 101–111)
GFR calc Af Amer: >60 mL/min (ref >60)
GFR calc non Af Amer: >60 mL/min (ref >60)
GLUCOSE: 111 mg/dL — AB (ref 65–99)
Potassium: 3.2 mmol/L — ABNORMAL LOW (ref 3.5–5.1)
Sodium: 138 mmol/L (ref 135–145)
TOTAL PROTEIN: 6.2 g/dL — AB (ref 6.5–8.1)

## 2016-12-18 LAB — CULTURE, BAL-QUANTITATIVE
CULTURE: NORMAL — AB
SPECIAL REQUESTS: NORMAL

## 2016-12-18 LAB — HCV RNA QUANT RFLX ULTRA OR GENOTYP
HCV RNA Qnt(log copy/mL): UNDETERMINED log10 IU/mL
HepC Qn: NOT DETECTED IU/mL

## 2016-12-18 LAB — PATHOLOGIST SMEAR REVIEW

## 2016-12-18 LAB — PROTIME-INR
INR: 1.37
Prothrombin Time: 17 seconds — ABNORMAL HIGH (ref 11.4–15.2)

## 2016-12-18 MED ORDER — FUROSEMIDE 10 MG/ML IJ SOLN
40.0000 mg | Freq: Two times a day (BID) | INTRAMUSCULAR | Status: DC
  Administered 2016-12-18: 40 mg via INTRAVENOUS
  Filled 2016-12-18: qty 4

## 2016-12-18 MED ORDER — CEFTRIAXONE SODIUM 2 G IJ SOLR
2.0000 g | INTRAMUSCULAR | Status: AC
Start: 2016-12-18 — End: 2017-01-22

## 2016-12-18 MED ORDER — MORPHINE SULFATE (PF) 2 MG/ML IV SOLN
2.0000 mg | INTRAVENOUS | 0 refills | Status: AC | PRN
Start: 2016-12-18 — End: ?

## 2016-12-18 MED ORDER — ENSURE ENLIVE PO LIQD: 237.0000 mL | Freq: Two times a day (BID) | ORAL | 12 refills | Status: AC

## 2016-12-18 MED ORDER — MAGNESIUM SULFATE 4 GM/100ML IV SOLN
4.0000 g | Freq: Once | INTRAVENOUS | Status: AC
  Administered 2016-12-18: 4 g via INTRAVENOUS
  Filled 2016-12-18 (×2): qty 100

## 2016-12-18 MED ORDER — POTASSIUM CHLORIDE CRYS ER 20 MEQ PO TBCR
40.0000 meq | EXTENDED_RELEASE_TABLET | ORAL | Status: AC
  Administered 2016-12-18 (×2): 40 meq via ORAL
  Filled 2016-12-18 (×2): qty 2

## 2016-12-18 MED ORDER — ENSURE ENLIVE PO LIQD: 237.0000 mL | Freq: Two times a day (BID) | ORAL | Status: DC

## 2016-12-18 MED ORDER — HEPARIN SODIUM (PORCINE) 5000 UNIT/ML IJ SOLN: 5000.0000 [IU] | Freq: Three times a day (TID) | INTRAMUSCULAR | Status: AC

## 2016-12-18 MED ORDER — ADULT MULTIVITAMIN LIQUID CH: 15.0000 mL | Freq: Every day | ORAL | Status: AC

## 2016-12-18 MED ORDER — LEVALBUTEROL HCL 0.63 MG/3ML IN NEBU: 0.6300 mg | INHALATION_SOLUTION | RESPIRATORY_TRACT | 12 refills | Status: AC | PRN

## 2016-12-18 MED ORDER — FENTANYL CITRATE (PF) 100 MCG/2ML IJ SOLN
50.0000 ug | INTRAMUSCULAR | Status: DC | PRN
  Administered 2016-12-18 (×4): 50 ug via INTRAVENOUS
  Filled 2016-12-18 (×4): qty 2

## 2016-12-18 MED ORDER — CYCLOBENZAPRINE HCL 7.5 MG PO TABS: 7.5000 mg | ORAL_TABLET | Freq: Three times a day (TID) | ORAL | 0 refills | Status: AC | PRN

## 2016-12-18 MED ORDER — ORAL CARE MOUTH RINSE: 15.0000 mL | Freq: Two times a day (BID) | OROMUCOSAL | 0 refills | Status: AC

## 2016-12-18 NOTE — Progress Notes (Signed)
Subjective:   Knee pain is better but she is requesting for narcotics  Antibiotics:  Anti-infectives    Start     Dose/Rate Route Frequency Ordered Stop   12/16/16 1800  cefTRIAXone (ROCEPHIN) 2 g in dextrose 5 % 50 mL IVPB     2 g 100 mL/hr over 30 Minutes Intravenous Every 24 hours 12/16/16 1211     12/15/16 1000  vancomycin (VANCOCIN) IVPB 1000 mg/200 mL premix  Status:  Discontinued     1,000 mg 200 mL/hr over 60 Minutes Intravenous Every 8 hours 12/15/16 0408 12/17/16 1916   12/15/16 1000  piperacillin-tazobactam (ZOSYN) IVPB 3.375 g  Status:  Discontinued     3.375 g 12.5 mL/hr over 240 Minutes Intravenous Every 8 hours 12/15/16 0408 12/16/16 1211   12/15/16 0415  vancomycin (VANCOCIN) IVPB 1000 mg/200 mL premix     1,000 mg 200 mL/hr over 60 Minutes Intravenous  Once 12/15/16 0408 12/15/16 0657   12/15/16 0415  piperacillin-tazobactam (ZOSYN) IVPB 3.375 g     3.375 g 100 mL/hr over 30 Minutes Intravenous  Once 12/15/16 0408 12/15/16 0603      Medications: Scheduled Meds: . acetylcysteine  4 mL Nebulization Q12H  . cefTRIAXone (ROCEPHIN)  IV  2 g Intravenous Q24H  . Chlorhexidine Gluconate Cloth  6 each Topical Daily  . furosemide  40 mg Intravenous Q12H  . heparin subcutaneous  5,000 Units Subcutaneous Q8H  . magnesium sulfate 1 - 4 g bolus IVPB  4 g Intravenous Once  . mouth rinse  15 mL Mouth Rinse BID  . multivitamin  15 mL Per Tube Daily  . potassium chloride  40 mEq Oral Q4H  . sodium chloride flush  10-40 mL Intracatheter Q12H   Continuous Infusions: . sodium chloride Stopped (12/17/16 1200)   PRN Meds:.cyclobenzaprine, fentaNYL (SUBLIMAZE) injection, levalbuterol, morphine injection, sodium chloride flush    Objective: Weight change:   Intake/Output Summary (Last 24 hours) at 12/18/16 1154 Last data filed at 12/18/16 1100  Gross per 24 hour  Intake              630 ml  Output             5030 ml  Net            -4400 ml   Blood  pressure (!) 152/102, pulse 94, temperature 97.5 F (36.4 C), temperature source Oral, resp. rate (!) 42, height _0  (1.626 m), weight 204 lb 12.9 oz (92.9 kg), SpO2 97 %. Temp:  [97.5 F (36.4 C)-99.2 F (37.3 C)] 97.5 F (36.4 C) (02/14 1150) Pulse Rate:  [89-107] 94 (02/14 1100) Resp:  [3-42] 42 (02/14 1100) BP: (101-152)/(59-112) 152/102 (02/14 1100) SpO2:  [93 %-99 %] 97 % (02/14 1100) FiO2 (%):  [28 %] 28 % (02/14 0802)  Physical Exam: General: Alert and awake, oriented x3, complaining of pain diffusely but in particular left knee HEENT: anicteric sclera, pupils reactive to light and accommodation, EOMI CVS tachycardic, normal r,  no murmur rubs or gallops Chest: Relatively clear to auscultation bilaterally anteriorly no wheezing Abdomen: soft nontender, nondistended, normal bowel sounds, Extremities: no  clubbing or edema noted bilaterally Skin: no rashes Neuro: nonfocal  CBC:  CBC Latest Ref Rng & Units 12/18/2016 12/17/2016 12/16/2016  WBC 4.0 - 10.5 K/uL 12.8(H) 7.1 7.8  Hemoglobin 12.0 - 15.0 g/dL 8.3(L) 7.2(L) 7.9(L)  Hematocrit 36.0 - 46.0 % 25.7(L) 22.9(L) 25.4(L)  Platelets 150 - 400 K/uL  387 278 251     BMET  Recent Labs  12/17/16 0707 12/18/16 0550  NA 135 138  K 3.5 3.2*  CL 104 100*  CO2 23 28  GLUCOSE 92 111*  BUN 16 12  CREATININE 1.12* 0.92  CALCIUM 7.7* 7.7*     Liver Panel   Recent Labs  12/17/16 0707 12/17/16 1130 12/18/16 0550  PROT 5.8* 6.2* 6.2*  ALBUMIN 1.6*  --  1.6*  AST 9*  --  13*  ALT <5*  --  <5*  ALKPHOS 106  --  129*  BILITOT 0.8  --  0.6       Sedimentation Rate No results for input(s): ESRSEDRATE in the last 72 hours. C-Reactive Protein No results for input(s): CRP in the last 72 hours.  Micro Results: Recent Results (from the past 720 hour(s))  Urine culture     Status: Abnormal   Collection Time: 12/10/16 10:21 PM  Result Value Ref Range Status   Specimen Description URINE, CLEAN CATCH  Final    Special Requests NONE  Final   Culture <10,000 COLONIES/mL INSIGNIFICANT GROWTH (A)  Final   Report Status 12/14/2016 FINAL  Final  Culture, blood (Routine X 2) w Reflex to ID Panel     Status: None   Collection Time: 12/11/16 12:23 AM  Result Value Ref Range Status   Specimen Description BLOOD LEFT ANTECUBITAL  Final   Special Requests IN PEDIATRIC BOTTLE 2CC  Final   Culture NO GROWTH 5 DAYS  Final   Report Status 12/16/2016 FINAL  Final  Culture, blood (Routine X 2) w Reflex to ID Panel     Status: None   Collection Time: 12/11/16 12:23 AM  Result Value Ref Range Status   Specimen Description BLOOD LEFT HAND  Final   Special Requests IN PEDIATRIC BOTTLE 2CC  Final   Culture NO GROWTH 5 DAYS  Final   Report Status 12/16/2016 FINAL  Final  Surgical PCR screen     Status: None   Collection Time: 12/11/16  5:13 PM  Result Value Ref Range Status   MRSA, PCR NEGATIVE NEGATIVE Final   Staphylococcus aureus NEGATIVE NEGATIVE Final    Comment:        The Xpert SA Assay (FDA approved for NASAL specimens in patients over 83 years of age), is one component of a comprehensive surveillance program.  Test performance has been validated by [facility] for patients greater than or equal to 65 year old. It is not intended to diagnose infection nor to guide or monitor treatment.   Aerobic/Anaerobic Culture (surgical/deep wound)     Status: None   Collection Time: 12/11/16  7:12 PM  Result Value Ref Range Status   Specimen Description TISSUE LEFT KNEE  Final   Special Requests SYNOVIAL  Final   Gram Stain   Final    ABUNDANT WBC PRESENT,BOTH PMN AND MONONUCLEAR NO ORGANISMS SEEN    Culture No growth aerobically or anaerobically.  Final   Report Status 12/16/2016 FINAL  Final  MRSA PCR Screening     Status: None   Collection Time: 12/14/16 10:47 PM  Result Value Ref Range Status   MRSA by PCR NEGATIVE NEGATIVE Final    Comment:        The GeneXpert MRSA Assay (FDA approved for NASAL  specimens only), is one component of a comprehensive MRSA colonization surveillance program. It is not intended to diagnose MRSA infection nor to guide or monitor treatment for MRSA infections.  Culture, blood (routine x 2)     Status: None (Preliminary result)   Collection Time: 12/15/16  1:54 AM  Result Value Ref Range Status   Specimen Description BLOOD LEFT ANTECUBITAL  Final   Special Requests BOTTLES DRAWN AEROBIC AND ANAEROBIC  5CC EA  Final   Culture NO GROWTH 2 DAYS  Final   Report Status PENDING  Incomplete  Culture, blood (routine x 2)     Status: None (Preliminary result)   Collection Time: 12/15/16  1:55 AM  Result Value Ref Range Status   Specimen Description BLOOD BLOOD LEFT HAND  Final   Special Requests BOTTLES DRAWN AEROBIC ONLY  4CC  Final   Culture NO GROWTH 2 DAYS  Final   Report Status PENDING  Incomplete  Culture, bal-quantitative     Status: Abnormal   Collection Time: 12/15/16 10:16 AM  Result Value Ref Range Status   Specimen Description BRONCHIAL ALVEOLAR LAVAGE  Final   Special Requests Normal  Final   Gram Stain   Final    RARE WBC PRESENT, PREDOMINANTLY PMN NO ORGANISMS SEEN    Culture (A)  Final    >=100,000 COLONIES/mL Consistent with normal respiratory flora.   Report Status 12/18/2016 FINAL  Final    Studies/Results: Dg Chest Port 1 View  Result Date: 12/18/2016 CLINICAL DATA:  40 y/o female with endocarditis, septic emboli. Pleural effusion. Initial encounter. EXAM: PORTABLE CHEST 1 VIEW COMPARISON:  12/17/2016 and earlier. FINDINGS: Portable AP semi upright view at 0520 hours. Stable right chest tube. Small volume right chest wall subcutaneous gas is stable. No pneumothorax. Stable left IJ central line. Stable cardiac size and mediastinal contours. Patchy right lung base and dense retrocardiac opacity persists. Stable pulmonary vascularity without overt edema. Negative visible bowel gas pattern. IMPRESSION: 1. Stable right chest tube and  left IJ central line. No pneumothorax. 2. Stable ventilation. Patchy residual right lung base opacity and dense retrocardiac collapse/consolidation. Electronically Signed   By: Genevie Ann M.D.   On: 12/18/2016 07:41   Dg Chest Port 1 View  Result Date: 12/17/2016 CLINICAL DATA:  Pleural effusion, right chest tube EXAM: PORTABLE CHEST 1 VIEW COMPARISON:  12/17/2016 FINDINGS: Interval extubation and removal of NG tube. Left internal jugular central line remains in place, unchanged. Interval placement of right chest tube. Decreasing right effusion. No pneumothorax. Cardiomegaly with vascular congestion and bilateral airspace disease, likely edema. IMPRESSION: Interval placement of right chest tube without visible pneumothorax. Decreasing right effusion. Continued diffuse bilateral airspace disease, likely CHF. Electronically Signed   By: Rolm Baptise M.D.   On: 12/17/2016 11:23   Dg Chest Port 1 View  Result Date: 12/17/2016 CLINICAL DATA:  Pleural effusion, polysubstance abuse, intubated patient, respiratory failure. EXAM: PORTABLE CHEST 1 VIEW COMPARISON:  Portable chest x-ray of December 16, 2016 FINDINGS: The lungs are reasonably well inflated. The interstitial markings remain increased. The hemidiaphragms remain obscured. The pulmonary vascularity remains engorged. Confluent alveolar opacity in the right mid lung is stable. The cardiac silhouette remains enlarged. The endotracheal tube tip 3.2 cm above the carina. The esophagogastric tube tip and proximal port project below the inferior margin of the image. The left internal jugular venous catheter tip projects over the midportion of the SVC. IMPRESSION: CHF with pulmonary interstitial and alveolar edema and bilateral posterior layering pleural effusions. Probable confluent pneumonia in the right mid lung and left lower lobes. There has not been significant interval change since yesterday's study. The support tubes are in reasonable position. Electronically  Signed   By: David  Martinique M.D.   On: 12/17/2016 07:00      Assessment/Plan:  INTERVAL HISTORY:   Patient self extubated  Patient is status post thoracocentesis     Principal Problem:   Staphylococcus aureus bacteremia with sepsis ([place]) Active Problems:   Tobacco abuse   IVDU (intravenous drug user)   Depression with anxiety   Septic arthritis of knee, left (HCC)   Endocarditis of tricuspid valve   Normocytic anemia   Polysubstance abuse   Pain syndrome, chronic   GERD (gastroesophageal reflux disease)   Sacroiliitis (HCC)   Acute pulmonary embolism (HCC)   Acute hypoxemic respiratory failure ([place])   Long term current use of opiate analgesic   Septic pulmonary embolism (HCC)   Hepatitis C antibody test positive   Cigarette smoker   Encounter for intubation   Encounter for nasogastric tube placement   Respiratory failure requiring intubation ([place])   Pleural effusion   Acute respiratory failure with hypoxia ([place])    Gabriella Ramos is a 41 y.o. 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  #1 MSSA bacteremia tricuspid valve endocarditis and septic knee:  We have also discontinued her central line  We will give her a "catheter holiday"  I will check blood cultures to ensure that surveillance cultures are negative prior to replacing a PICC    I think obtaining a transesophageal echocardiogram here at [facility] may be a good idea but I appreciate that she is at risk for intubation and would not push this yet   #2 Septic knee that is post open arthrotomy by orthopedic surgery, today she is climbing her knee pain is better   #3 pleural effusions: Has post placement of chest tube pleural LDH is elevated but protein low. Sites  #4 hepatitis C positive antibody needs quantitative hep C and genotype tested  treatment will be deferred to an outpatient basis  #5 IV drug use she says that she is scared to use again. She is going to be need to be very diligent and going to be engaged in a treatment program for this long-term.  #6 HCAP: I doubt she has an actual H CAP With a new organism. This is undoubtedly all staph aureus with embolization to the lungs:  I have narrowed to ceftriaxone  #7 sacroiliitis: She is going to get a protracted course of IV antibiotics would reimage it back pain worsens.  I spent greater than 35  minutes with the patient including greater than 50% of time in face to face counsel of the patient regarding her infected heart valve septic embolic lungs infected knee and in coordination of her care.    LOS: 4 days   Alcide Evener 12/18/2016, 11:54 AM

## 2016-12-18 NOTE — Discharge Summary (Signed)
Physician Discharge Summary  Patient ID: Gabriella Ramos MRN: [id number] DOB/AGE: 1976/04/23 41 y.o.  Admit date: 12/14/2016 Discharge date: 12/18/2016    Discharge Diagnoses:  Acute Hypoxic Respiratory Failure in setting of Sepsis secondary to Bacterial Endocarditis, PNA & Atelectasis  Right Mainstem Intubation with R lung white out Large Left Exudative Pleural Effusion  PE Ruled Out MSSA Bacterial Endocarditis  Tricuspid Valve Vegetation  Elevated Troponin and BNP  Hypokalemia  Hypomagnesemia  Anemia of Chronic Disease Hx of GIB MSSA Right Knee Septic Arthritis  Sacroiliitis                                                                        DISCHARGE PLAN BY DIAGNOSIS     Acute Hypoxic Respiratory Failure in setting of Sepsis secondary to Bacterial Endocarditis, PNA & Atelectasis  Right Mainstem Intubation with R lung white out Large Left Exudative Pleural Effusion  PE Ruled Out  Discharge Plan: Continue pulmonary hygiene - IS, mobilize as able  Chest tube care per protocol  Continue water seal for chest tube Daily CXR while chest tube in place Anticipate chest tube can be discontinued in 48- 72 hours.  Would discontinue if chest tube drainage < 100 ml in 24 hours.  Follow up CXR 4 hours post removal.   Continue O2 as needed for saturations > 92% PRN morphine for pain > chronic pain issues at baseline  MSSA Bacterial Endocarditis  Tricuspid Valve Vegetation  Elevated Troponin and BNP   Discharge Plan: Continue rocephin for 6 weeks total, initial start date 2/7 > stop date 3/21 Discussed with ID, no follow up TEE  Follow cultures to maturity No central access for 72 hours from 2/14 to allow for line holiday   Hypokalemia  Hypomagnesemia   Discharge Plan: Trend BMP, Mg, Phos Replace electrolytes as indicated   Anemia of Chronic Disease Hx of GIB Hepatitis C Positive Antibody  Discharge Plan: Monitor for bleeding  Trend CBC  Will need  quantitative hepatitis C and genotype testing completed as an outpatient  MSSA Right Knee Septic Arthritis status post wash out  Discharge Plan: See antibiotic plans as above  Follow up with Ortho as previously arranged  Monitor right knee    Discharge Plan: Complete abx as above, if new or worsening pain would re-image                  DISCHARGE SUMMARY   Gabriella Ramos is a 41 y.o. y/o female with a PMH of polysubstance abuse, tobacco abuse, anemia, IVDU, chronic pain syndrome, depression, anxiety, GERD & constipation admitted 2/10 with knee pain and swelling.  She was recently admitted to [facility] 11/25/16 for RLL PNA and found to have acute endocarditis with a large tricuspid valve vegetation (MSSA grew on blood cultures) on ECHO.  She was transferred to [facility] with plans of prolonged course antibiotics.  While at [facility], she began to complain of knee pain and swelling.  The joint was aspirated and synovial culture grew MRSA.  Orthopaedics were consulted at [place] and wash out was recommended.  Lower extremity venous doppler was negative for DVT.  Concurrently, she was noted to be anemic and was transfused one unit of blood (2/5)  without obvious source of bleeding identified.  She later (2/10) developed pleuritic chest pain and shortness of breath.  CT was concerning for possible right pulmonary embolism and she was started on a heparin gtt.  Later the patient developed hemoptysis.      She arrived to [facility] and was in acute distress and was intubated immediately for respiratory distress. She was initially treated with a heparin gtt.  Follow up CTA of the chest was completed 2/11 and was negative for pulmonary emboli but showed new patchy bilateral infiltrates with large pleural effusions & scattered mediastinal and hilar lymphadenopathy. Lower extremity duplex completed (2/12) and negative for DVT.  Infectious disease was consulted.  Initially, it was  considered to repeat TEE but decision was made not to repeat.  A line holiday was recommended for minimum of 72 hours (no central access).   Blood cultures were repeated and are negative at time of discharge.  During course of hospitalization, CXR was concerning worsening effusion / possible empyema.  A large bore chest tube (24F) was placed for pleural drainage and fluid assessment.  The pleural fluid culture is negative to date and consistent with an exudative effusion by LDH.  The patient has a history of chronic pain and pain control was an issue post chest tube placement.  Pain control was achieved with PRN morphine.  Chest tube was transitioned to water seal prior to discharge.  There was no noted source of bleeding during hospitalization and Hgb remains stable at 8.3.  She was diuresed during the course of the hospitalization for volume overload.  The patient self extubated am of 2/13 and tolerated.  The patient was medically cleared for discharge 2/14 with plans as above.            MICRO DATA  BCx2 2/11 >> no growth to date >>  R Pleural Fluid 2/11 >>   ANTIBIOTICS Rocephin 2/7 >>    Discharge Exam: General: chronically ill appearing female in NAD Neuro: AAOx4, speech clear, MAE  CV: s1s2 rrr, no m/r/g PULM: even/non-labored, lungs bilaterally clear, R chest tube to water seal, c/d/i NT:ZGYFV/CBSW, bsx4 active  Extremities: warm/dry, no edema   Vitals:   12/18/16 1100 12/18/16 1150 12/18/16 1200 12/18/16 1602  BP: (!) 152/102  (!) 143/91   Pulse: 94  95   Resp: (!) 42     Temp:  97.5 F (36.4 C)  98.2 F (36.8 C)  TempSrc:  Oral  Axillary  SpO2: 97%  97%   Weight:      Height:         Discharge Labs  BMET  Recent Labs Lab 12/13/16 0426 12/14/16 2258 12/16/16 0656 12/17/16 0707 12/18/16 0550  NA 136 139 139 135 138  K 4.2 4.8 4.4 3.5 3.2*  CL 105 106 107 104 100*  CO2 21* _0 GLUCOSE 95 126* 87 92 111*  BUN _1 CREATININE 0.86 0.99 1.09*  1.12* 0.92  CALCIUM 8.0* 8.4* 8.0* 7.7* 7.7*  MG  --   --  1.7 1.5*  --   PHOS  --   --  4.5 4.4  --     CBC  Recent Labs Lab 12/16/16 0436 12/17/16 0707 12/18/16 0550  HGB 7.9* 7.2* 8.3*  HCT 25.4* 22.9* 25.7*  WBC 7.8 7.1 12.8*  PLT 251 278 387    Anti-Coagulation  Recent Labs Lab 12/14/16 2258 12/16/16 0554 12/17/16 0707 12/18/16 0550  INR 1.34  1.21 1.17 1.37    Discharge Instructions    Activity as tolerated - No restrictions    Complete by:  As directed    Bed rest    Complete by:  As directed    Bed rest while chest tube in place   Call MD for:  difficulty breathing, headache or visual disturbances    Complete by:  As directed    Call MD for:  extreme fatigue    Complete by:  As directed    Call MD for:  hives    Complete by:  As directed    Call MD for:  persistant dizziness or light-headedness    Complete by:  As directed    Call MD for:  persistant nausea and vomiting    Complete by:  As directed    Call MD for:  severe uncontrolled pain    Complete by:  As directed    Call MD for:  temperature >100.4    Complete by:  As directed    Diet - low sodium heart healthy    Complete by:  As directed    Discharge instructions    Complete by:  As directed    1.  Review medications carefully as they have changed 2.  Patient needs a minimum of 72 hour line holiday from 2/14 before any central access placed 3.  Continue rocephin for 6 weeks > initial start date 2/7 4.  Chest tube care per protocol, continue water seal.  Likely can be discontinued in next 72 hours 5. Follow up pleural fluid cultures 6. Return to ER if new or worsening symptoms.   Increase activity slowly    Complete by:  As directed           Allergies as of 12/18/2016      Reactions   Ketorolac Tromethamine Shortness Of Breath   Sulfa Antibiotics       Medication List    STOP taking these medications   ALPRAZolam 1 MG tablet Commonly known as:  XANAX   carisoprodol 350 MG  tablet Commonly known as:  SOMA   diphenhydrAMINE 25 mg capsule Commonly known as:  BENADRYL   ibuprofen 200 MG tablet Commonly known as:  ADVIL,MOTRIN   ICY HOT EX   oxymorphone 20 MG 12 hr tablet Commonly known as:  OPANA ER     TAKE these medications   cefTRIAXone 2 g in dextrose 5 % 50 mL Inject 2 g into the vein daily.   cyclobenzaprine 7.5 MG tablet Commonly known as:  FEXMID Take 1 tablet (7.5 mg total) by mouth every 8 (eight) hours as needed for muscle spasms.   feeding supplement (ENSURE ENLIVE) Liqd Take 237 mLs by mouth 2 (two) times daily between meals. Start taking on:  12/19/2016   heparin 5000 UNIT/ML injection Inject 1 mL (5,000 Units total) into the skin every 8 (eight) hours.   levalbuterol 0.63 MG/3ML nebulizer solution Commonly known as:  XOPENEX Take 3 mLs (0.63 mg total) by nebulization every 4 (four) hours as needed for wheezing or shortness of breath.   morphine 2 MG/ML injection Inject 1-2 mLs (2-4 mg total) into the vein every 2 (two) hours as needed.   mouth rinse Liqd solution 15 mLs by Mouth Rinse route 2 (two) times daily.   multivitamin Liqd Place 15 mLs into feeding tube daily. Start taking on:  12/19/2016         Disposition:  [facility]  Discharged Condition: Char[place]ta  Jeani Hawking Gerdts has met maximum benefit of inpatient care and is medically stable and cleared for discharge.  Patient is pending follow up as above.      Time spent on disposition:  35 minutes.   Signed: Noe Gens, NP-C [place] [facility] Pgr: [phone] Office: [phone]  [place] with above  Lavon Paganini. Titus Mould, MD, [place] Pgr: [place] [facility] 01/06/2017 10:59 PM

## 2016-12-18 NOTE — Progress Notes (Signed)
PULMONARY / CRITICAL CARE MEDICINE   Name: Gabriella Ramos MRN: [id number] DOB: Apr 08, 1976    ADMISSION DATE:  12/14/2016   CHIEF COMPLAINT:  Left knee pain  HISTORY OF PRESENT ILLNESS:   41 y.o. female with medical history significant of polysubstance abuse, tobacco abuse, anemia, IVDU, chronic pain syndrome, depression, anxiety, GERD, constipation, who presents with left knee pain and swelling.  Patient was recently admitted to [facility] 11/[facility]/16 due to right lower lobe pneumonia with multiple bilateral foci. She was found to have acute endocarditis with a fragile large tricuspid vegetation (MSSA on Bcx) that it was 2 cm in size by 2-D echo. [facility] was [facility] for continuation of IV Abx of Cefazoline for total of 8 weeks with a stop date of 01/13/17. While in [facility], patient complains of left kidney swelling and pain. Left knee joint aspiration was done, and culture of synovial fluid grew out methicillin-sensitive staph aureus. Ortho was consulted in [facility] and recommended knee washout. Per report, pt has anemia, hemolytic anemia workup tests including indirect bilirubin, LDH, haptoglobin and peripheral smear were sent out, the results are pending. Pt was transfused with one units of blood on 12/09/16. Her Hgb was 7.9 on 12/10/16. Pt had negative left LE venous doppler for DVT on 12/05/16.  Per the [facility] transfer note (in patients chart), on 12/14/2016 she reported pleuritic chest pain and SOB.  CT PE demonstrated Right pulmonary embolism.  Several days ago at [facility] she was noted to have a hemoglobin drop to 6 where she received 2U PRBCs.  No obvious sources of bleeding were found and hemolysis labs negative.  She arrived on the floor and was found to be in acute distress with severe work of breathing and 1 word dyspnea, we elected to intubate immediately.  SUBJECTIVE: pain reported but improved pain  VITAL SIGNS: Temp:  [97.5 F (36.4  C)-99.2 F (37.3 C)] 97.5 F (36.4 C) (02/14 0737) Pulse Rate:  [89-107] 98 (02/14 0700) Resp:  [3-42] 33 (02/14 0700) BP: (101-152)/(59-112) 145/94 (02/14 0700) SpO2:  [91 %-99 %] 98 % (02/14 0802) FiO2 (%):  [28 %] 28 % (02/14 0802)   HEMODYNAMICS:   VENTILATOR SETTINGS: FiO2 (%):  [28 %] 28 % INTAKE / OUTPUT:  Intake/Output Summary (Last 24 hours) at 12/18/16 0907 Last data filed at 12/18/16 0700  Gross per 24 hour  Intake              850 ml  Output             5890 ml  Net            -5040 ml   PHYSICAL EXAMINATION: General:  Acutely ill young female, no distress Neuro:  Awake, cooperative HEENT:  jvd wnl Cardiovascular s1 s2 RRR Lungs:  Coarse  But improved aeration rt base, chest tube, no leak Abdomen:  Soft, NT, ND and +BS Skin:  Intact Ext: no toe nails  LABS:  CBC  Recent Labs Lab 12/16/16 0436 12/17/16 0707 12/18/16 0550  WBC 7.8 7.1 12.8*  HGB 7.9* 7.2* 8.3*  HCT 25.4* 22.9* 25.7*  PLT 251 278 387   Coag's  Recent Labs Lab 12/14/16 2258 12/16/16 0554 12/17/16 0707 12/18/16 0550  APTT 89*  --   --   --   INR 1.34 1.21 1.17 1.37   BMET  Recent Labs Lab 12/16/16 0656 12/17/16 0707 12/18/16 0550  NA 139 135 138  K 4.4 3.5 3.2*  CL 107 104 100*  CO2 25 23 28   BUN 14 16 12   CREATININE 1.09* 1.12* 0.92  GLUCOSE 87 92 111*   Electrolytes  Recent Labs Lab 12/16/16 0656 12/17/16 0707 12/18/16 0550  CALCIUM 8.0* 7.7* 7.7*  MG 1.7 1.5*  --   PHOS 4.5 4.4  --    Sepsis Markers  Recent Labs Lab 12/14/16 2330  LATICACIDVEN 1.6   ABG  Recent Labs Lab 12/14/16 2255 12/15/16 0512 12/16/16 0452  PHART 7.256* 7.309* 7.299*  PCO2ART 51.9* 49.0* 49.9*  PO2ART 293.0* 183.0* 96.7   Liver Enzymes  Recent Labs Lab 12/12/16 0424 12/17/16 0707 12/18/16 0550  AST 10* 9* 13*  ALT <5* <5* <5*  ALKPHOS 105 106 129*  BILITOT 0.2* 0.8 0.6  ALBUMIN 1.5* 1.6* 1.6*   Cardiac Enzymes  Recent Labs Lab 12/14/16 2258  12/15/16 0922 12/15/16 1639  TROPONINI 0.03* <0.03 <0.03   Glucose  Recent Labs Lab 12/15/16 0409 12/16/16 1638 12/16/16 2018 12/17/16 0008 12/17/16 0419 12/17/16 0806  GLUCAP 116* 88 98 96 104* 83   Imaging Dg Chest Port 1 View  Result Date: 12/18/2016 CLINICAL DATA:  41 y/o female with endocarditis, septic emboli. Pleural effusion. Initial encounter. EXAM: PORTABLE CHEST 1 VIEW COMPARISON:  12/17/2016 and earlier. FINDINGS: Portable AP semi upright view at 0520 hours. Stable right chest tube. Small volume right chest wall subcutaneous gas is stable. No pneumothorax. Stable left IJ central line. Stable cardiac size and mediastinal contours. Patchy right lung base and dense retrocardiac opacity persists. Stable pulmonary vascularity without overt edema. Negative visible bowel gas pattern. IMPRESSION: 1. Stable right chest tube and left IJ central line. No pneumothorax. 2. Stable ventilation. Patchy residual right lung base opacity and dense retrocardiac collapse/consolidation. Electronically Signed   By: Odessa Fleming M.D.   On: 12/18/2016 07:41   Dg Chest Port 1 View  Result Date: 12/17/2016 CLINICAL DATA:  Pleural effusion, right chest tube EXAM: PORTABLE CHEST 1 VIEW COMPARISON:  12/17/2016 FINDINGS: Interval extubation and removal of NG tube. Left internal jugular central line remains in place, unchanged. Interval placement of right chest tube. Decreasing right effusion. No pneumothorax. Cardiomegaly with vascular congestion and bilateral airspace disease, likely edema. IMPRESSION: Interval placement of right chest tube without visible pneumothorax. Decreasing right effusion. Continued diffuse bilateral airspace disease, likely CHF. Electronically Signed   By: Charlett Nose M.D.   On: 12/17/2016 11:23   ASSESSMENT / PLAN:  PULMONARY A: Acute hypoxemic respiratory failure Right lung white out after right mainstem intubation Submassive PE NOT noted on CT at [facility] Large effusion rt, s/p CT  2/13 - exudate per LDH PNA ATX  P:   - no leak, output 20 cc, to water seal, pcxr much improved -neg 5 liters, lasix as tolerated - residual edema remain son pcxr -low O2 needs -mobilize -control pain -IS  CARDIOVASCULAR A:  Bacterial endocarditis TV vegitation Elevated troponin and BNP - 2/2 submassive PE  P:  - Blood cultures to follow- remains neg -to dc picc, agree - 2D echo ordered- pending, ID recommending TEE, will assess tolerability in next 24 hours, would likley get intubated undergo TEE for now  RENAL A:   overload P:   - BMET in AM - k supp, mag supp - lasix maintain same dose -kvo  GASTROINTESTINAL A:   No overt Gi bleeding noted P:   - advance diet -need BM= done per pt -ppi, dc if not home med  HEMATOLOGIC A:   Anemia of  chronic disease Gi bleed concerns at [facility] PE neg at [facility] P:  - sub q hep -she did hemoconcetrate on lasix, maintain  INFECTIOUS A:   MSSA - TV bacterial endocarditis Started Cefazolin on 11/18/2016 plan was for 8 weeks with stop date 01/13/2017  MSSA - Right knee septic arthritis s/p washout  P:   BCx2 - 12/15/2016 Per ID Dc picc ceftriaxone  ENDOCRINE A:   Not active   P:   - CBg assessments  NEUROLOGIC A:   Sedated on propofol Self extubated P:   RASS goal: 0 Fentanyl May need transition to dilauded  To [facility], will stay as pulm To med floor  Mcarthur Rossetti. Tyson Alias, MD, FACP Pgr: [phone] [facility]

## 2016-12-18 NOTE — Progress Notes (Signed)
[facility] rep called and they will take pt back to [facility] whenever md feels stable to go back. Alerted pa brandi that bed still avial when md ready.

## 2016-12-18 NOTE — Progress Notes (Signed)
Patient transported out via [facility]. Patients belongings included 3 bags and blanket sent with patient. VSS.

## 2016-12-18 NOTE — Progress Notes (Signed)
Pt refusing to allow me to reposition her in the bed and change dressing to her leg. States her pain is too bad. PRN pain medication given. Will continue to monitor.

## 2016-12-18 NOTE — Care Management Note (Signed)
Case Management Note  Patient Details  Name: Gabriella Ramos MRN: [id number] Date of Birth: 08/23/1976  Subjective/Objective:      Adm w infection, chest tube              Action/Plan:to return to [facility].   Expected Discharge Date:                  Expected Discharge Plan:  [facility] ([facility])  In-House Referral:     Discharge planning Services  CM Consult  Post Acute Care Choice:    Choice offered to:  Patient  DME Arranged:    DME Agency:     HH Arranged:    HH Agency:     Status of Service:  Completed, signed off  If discussed at [employer] Length of Stay Meetings, dates discussed:    Additional Comments:pt in agreement and cobra signed by pt for dc to [facility].  Hanley Haysowell, Marlow Berenguer T, RN 12/18/2016, 3:50 PM

## 2016-12-18 NOTE — Progress Notes (Signed)
[facility]. Report given to [facility] RN. [facility] called and on the way. Will continue to monitor.

## 2016-12-20 LAB — CULTURE, BLOOD (ROUTINE X 2)
CULTURE: NO GROWTH
Culture: NO GROWTH

## 2016-12-22 LAB — BODY FLUID CULTURE: Culture: NO GROWTH

## 2016-12-23 LAB — CULTURE, BLOOD (ROUTINE X 2)
CULTURE: NO GROWTH
Culture: NO GROWTH

## 2017-01-08 ENCOUNTER — Other Ambulatory Visit (HOSPITAL_COMMUNITY): Payer: Self-pay | Admitting: Internal Medicine

## 2017-01-08 DIAGNOSIS — R109 Unspecified abdominal pain: Secondary | ICD-10-CM

## 2017-01-17 ENCOUNTER — Encounter (HOSPITAL_COMMUNITY)
Admission: RE | Admit: 2017-01-17 | Discharge: 2017-01-17 | Disposition: A | Discharge status: Home / Self Care | Payer: Medicaid Other | Source: Ambulatory Visit | Attending: Internal Medicine | Admitting: Internal Medicine

## 2017-01-17 DIAGNOSIS — R109 Unspecified abdominal pain: Secondary | ICD-10-CM

## 2017-01-17 MED ORDER — TECHNETIUM TC 99M MEBROFENIN IV KIT
5.0000 | PACK | Freq: Once | INTRAVENOUS | Status: AC | PRN
Start: 2017-01-17 — End: 2017-01-17
  Administered 2017-01-17: 5 via INTRAVENOUS

## 2017-01-21 ENCOUNTER — Ambulatory Visit: Payer: Medicaid Other | Admitting: Internal Medicine

## 2018-04-29 IMAGING — MR MR HIP*L* W/O CM
4 of 5 series · 25 of 40 positions shown · non-contrast
Comparison: None.

CLINICAL DATA: IVDA.  Rule out septic arthritis left hip.

EXAM:
MR OF THE LEFT HIP WITHOUT CONTRAST
TECHNIQUE: Multiplanar, multisequence MR imaging was performed. No intravenous
contrast was administered.

[Series 5: T1 · axial · 3.5mm · 1.41mm/px · z∈[-8,+132]mm · 8 of 36 slices shown (1 of 2)]
[im 1/36]
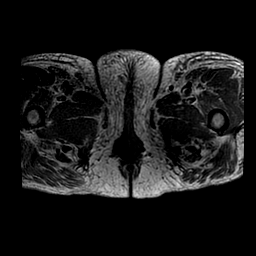
[im 6/36]
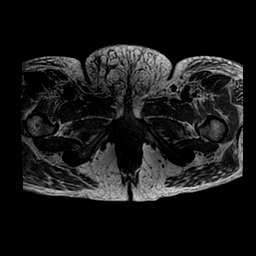
[im 11/36]
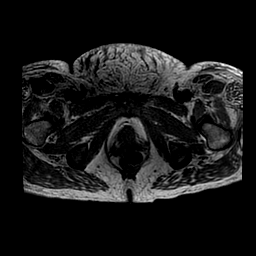
[im 16/36]
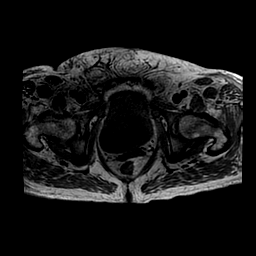
[im 21/36]
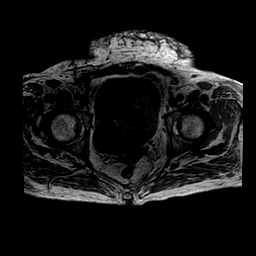
[im 26/36]
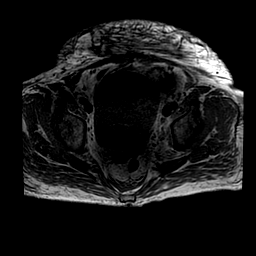
[im 31/36]
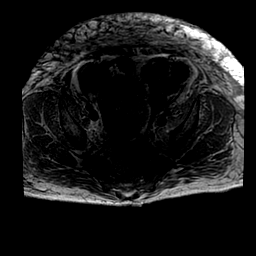
[im 36/36]
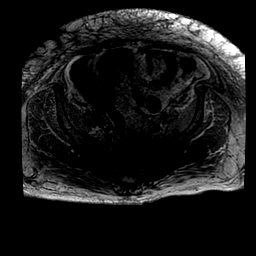

[Series 6: T2 fat-sat · axial · 3.5mm · 0.70mm/px · z∈[-8,+132]mm · 8 of 36 slices shown]
[im 1/36]
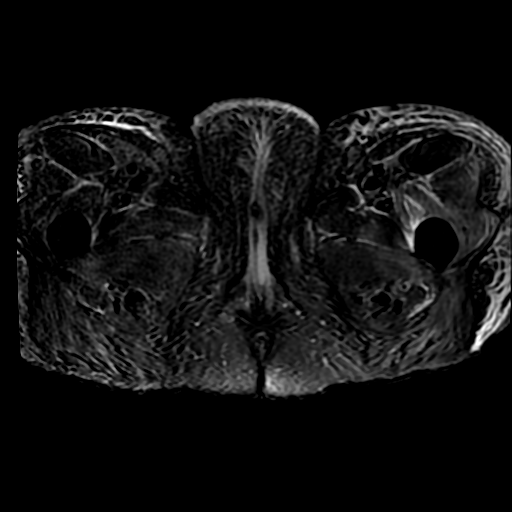
[im 6/36]
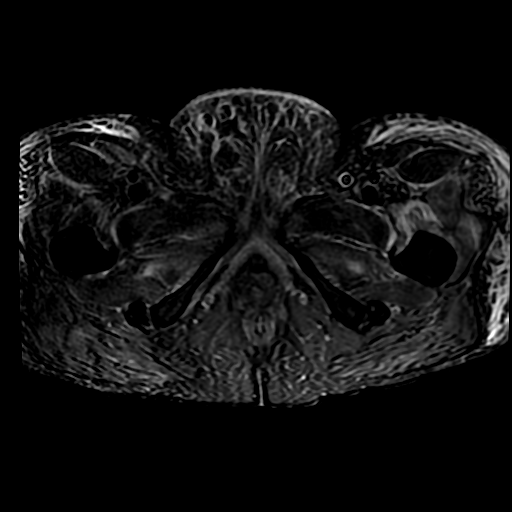
[im 11/36]
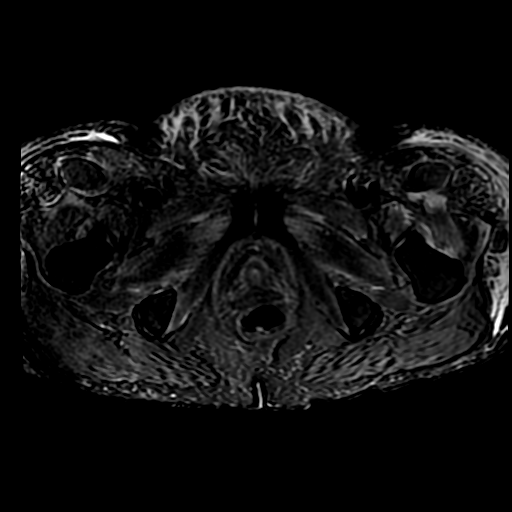
[im 16/36]
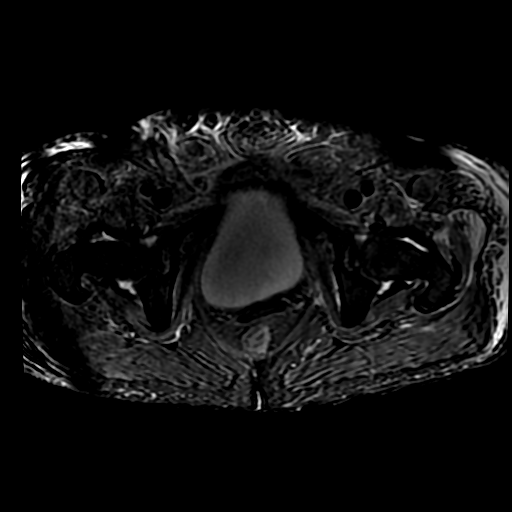
[im 21/36]
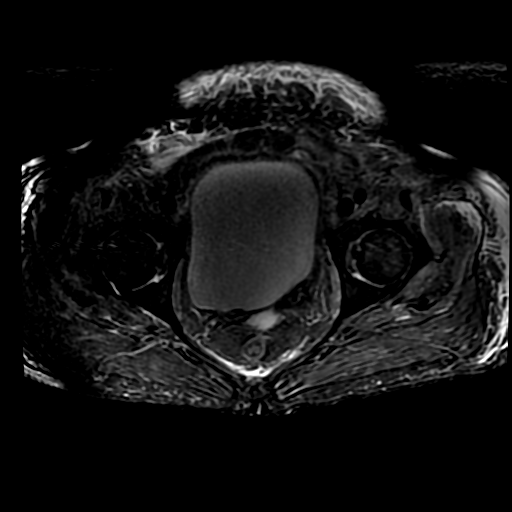
[im 26/36]
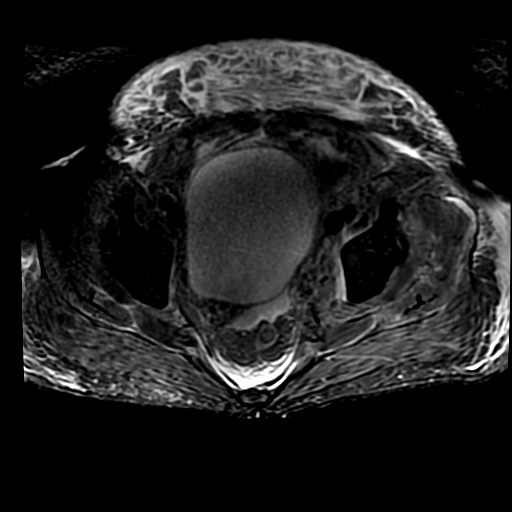
[im 31/36]
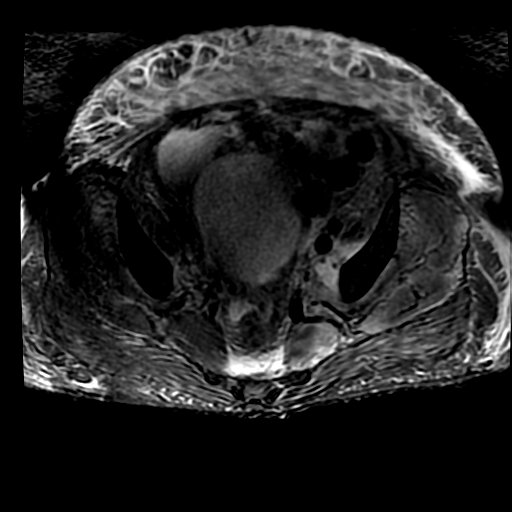
[im 36/36]
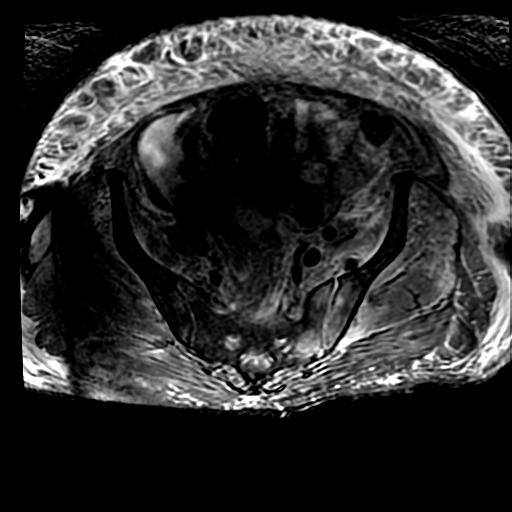

[Series 8: T1 · coronal · 3.5mm · 0.70mm/px · 3 of 40 slices shown (2 of 2)]
[im 5/40]
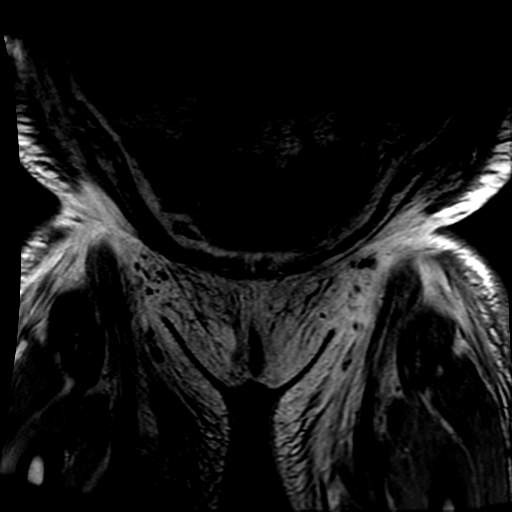
[im 20/40]
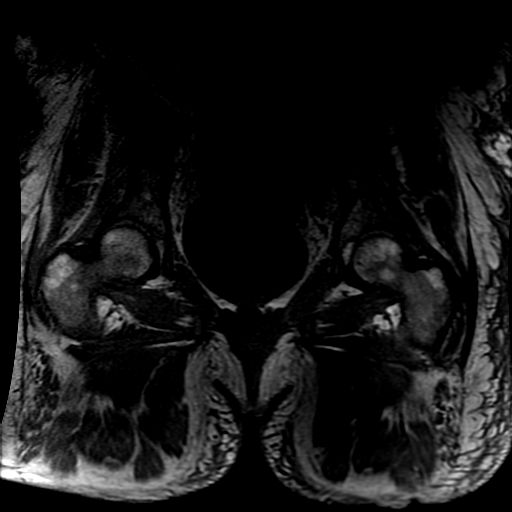
[im 35/40]
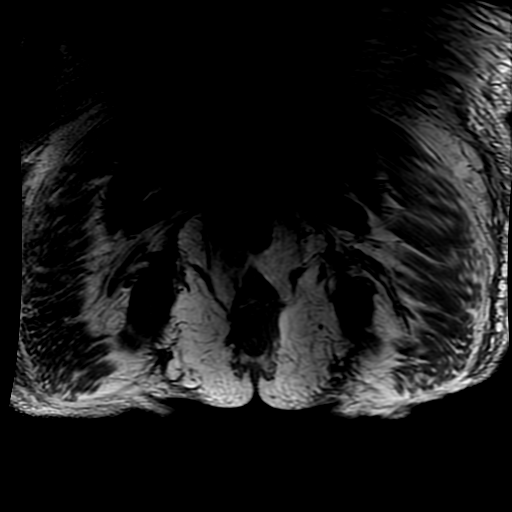

[Series 9: PD fat-sat · sagittal · 4.0mm · 0.27mm/px · 6 of 29 slices shown]
[im 1/29]
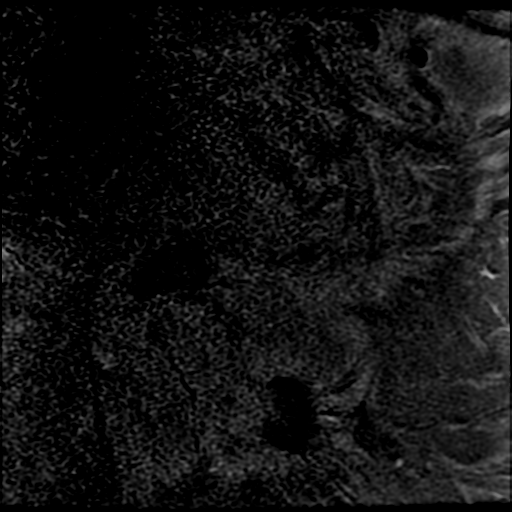
[im 6/29]
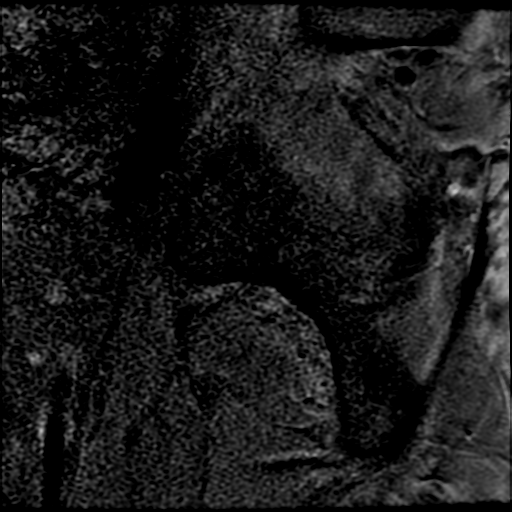
[im 12/29]
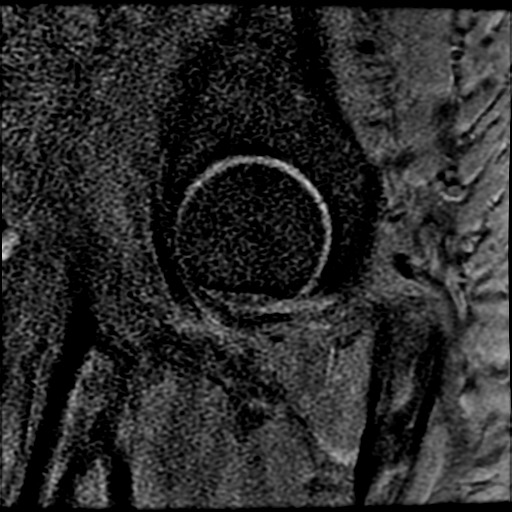
[im 17/29]
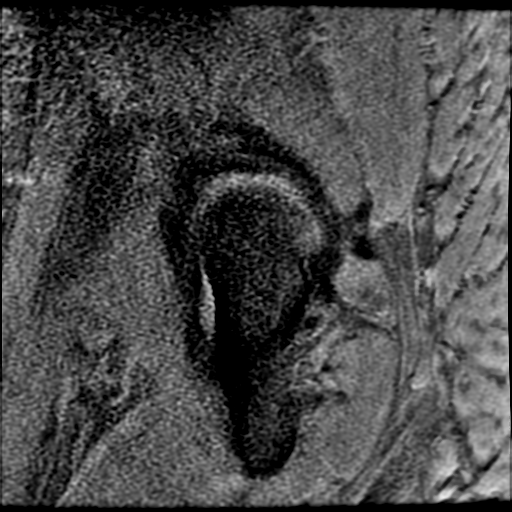
[im 23/29]
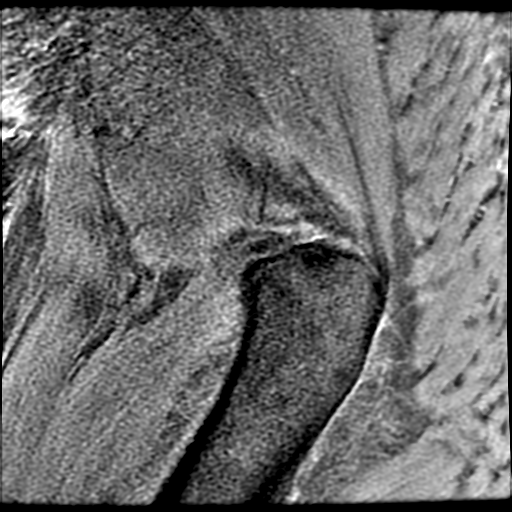
[im 29/29]
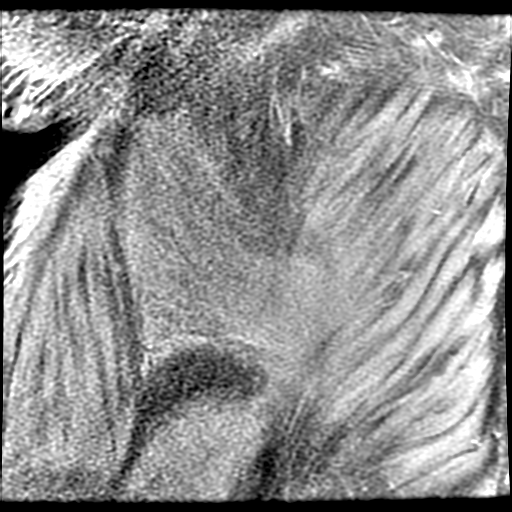

[25 of 40 positions shown; findings below may reference images not displayed]

FINDINGS: Bones: Bone marrow edema noted along both sides of the synovial
portion of the left SI joint consistent with sacroiliitis.

Articular cartilage and labrum

Articular cartilage:  No focal chondral defect.

Labrum:  Intact.

Joint or bursal effusion

Joint effusion:  No significant joint effusion.

Bursae:  No abnormal nor significant bursal fluid.

Muscles and tendons

Muscles and tendons:  No intramuscular hemorrhage or mass.

Other findings

Miscellaneous: Ascites within the pelvis. Diffuse anasarca noted of
the pelvis.
IMPRESSION: 1. Diffuse anasarca with ascites.
2. Bone marrow edema about left SI joint consistent sacroiliitis.
3. No labral tear of the left hip.  No focal chondral defect.
4. No hip joint effusion, marrow edema nor bone destruction to
suggest septic arthritis.
